# Patient Record
Sex: Female | Born: 1990 | Race: Black or African American | Hispanic: No | Marital: Single | Smoking: Never smoker
Health system: Southern US, Community
[De-identification: ages and names within clinical notes are randomized; demographics above are authoritative.]

## PROBLEM LIST (undated history)

## (undated) DIAGNOSIS — Z789 Other specified health status: Secondary | ICD-10-CM

## (undated) HISTORY — PX: NO PAST SURGERIES: SHX2092

---
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
## 2011-11-12 ENCOUNTER — Emergency Department (HOSPITAL_COMMUNITY)
Admission: EM | Admit: 2011-11-12 | Discharge: 2011-11-12 | Disposition: A | Discharge status: Home / Self Care | Payer: Self-pay | Attending: Emergency Medicine | Admitting: Emergency Medicine

## 2011-11-12 ENCOUNTER — Encounter (HOSPITAL_COMMUNITY): Payer: Self-pay | Admitting: *Deleted

## 2011-11-12 ENCOUNTER — Emergency Department (HOSPITAL_COMMUNITY): Payer: Self-pay

## 2011-11-12 DIAGNOSIS — R079 Chest pain, unspecified: Secondary | ICD-10-CM | POA: Insufficient documentation

## 2011-11-12 DIAGNOSIS — R071 Chest pain on breathing: Secondary | ICD-10-CM | POA: Insufficient documentation

## 2011-11-12 DIAGNOSIS — R0789 Other chest pain: Secondary | ICD-10-CM

## 2011-11-12 DIAGNOSIS — R091 Pleurisy: Secondary | ICD-10-CM | POA: Insufficient documentation

## 2011-11-12 LAB — CBC WITH DIFFERENTIAL/PLATELET
Basophils Absolute: 0.1 10*3/uL (ref 0.0–0.1)
Eosinophils Absolute: 0.1 10*3/uL (ref 0.0–0.7)
Lymphs Abs: 2.6 10*3/uL (ref 0.7–4.0)
MCH: 23.3 pg — ABNORMAL LOW (ref 26.0–34.0)
MCHC: 31.8 g/dL (ref 30.0–36.0)
MCV: 73.3 fL — ABNORMAL LOW (ref 78.0–100.0)
Monocytes Absolute: 0.8 10*3/uL (ref 0.1–1.0)
Monocytes Relative: 8 % (ref 3–12)
Platelets: 393 10*3/uL (ref 150–400)
RDW: 14.7 % (ref 11.5–15.5)
WBC: 9.8 10*3/uL (ref 4.0–10.5)

## 2011-11-12 LAB — BASIC METABOLIC PANEL
BUN: 10 mg/dL (ref 6–23)
Calcium: 9.3 mg/dL (ref 8.4–10.5)
Creatinine, Ser: 0.68 mg/dL (ref 0.50–1.10)
GFR calc Af Amer: >90 mL/min (ref >90)
GFR calc non Af Amer: >90 mL/min (ref >90)
Glucose, Bld: 83 mg/dL (ref 70–99)

## 2011-11-12 MED ORDER — OXYCODONE-ACETAMINOPHEN 5-325 MG PO TABS: 1.0000 | ORAL_TABLET | Freq: Four times a day (QID) | ORAL | Status: AC | PRN

## 2011-11-12 MED ORDER — OXYCODONE-ACETAMINOPHEN 5-325 MG PO TABS
1.0000 | ORAL_TABLET | Freq: Once | ORAL | Status: AC
  Administered 2011-11-12: 1 via ORAL

## 2011-11-12 MED ORDER — OXYCODONE-ACETAMINOPHEN 5-325 MG PO TABS
1.0000 | ORAL_TABLET | Freq: Once | ORAL | Status: DC
  Filled 2011-11-12: qty 1

## 2011-11-12 MED ORDER — IOHEXOL 350 MG/ML SOLN
86.0000 mL | Freq: Once | INTRAVENOUS | Status: AC | PRN
  Administered 2011-11-12: 86 mL via INTRAVENOUS

## 2011-11-12 NOTE — ED Notes (Signed)
Pt states started having right rib pain three days ago that started with out injury.  Pt reports hurts with deep breath and to lay on that side

## 2011-11-12 NOTE — ED Notes (Signed)
Patient transported to X-ray 

## 2011-11-12 NOTE — ED Notes (Signed)
Pt reports (R) side rib pain x3 days, increase pain w/deep breathing or laying on her (R) side. Pt denies chest pain, N/V, or cough, pt also denies any injury to area.

## 2011-11-12 NOTE — ED Notes (Signed)
MD at bedside. Dr. Plunkett. 

## 2011-11-12 NOTE — ED Provider Notes (Addendum)
History     CSN: [id number]  Arrival date & time 11/12/11  0905   First MD Initiated Contact with Patient 11/12/11 [phone]      Chief Complaint  Patient presents with  . right rib pain     (Consider location/radiation/quality/duration/timing/severity/associated sxs/prior treatment) Patient is a 21 y.o. female presenting with chest pain. The history is provided by the patient.  Chest Pain Episode onset: 3 days ago. Duration of episode(s) is 3 days. Chest pain occurs constantly. The chest pain is worsening. The pain is associated with breathing and coughing. At its most intense, the pain is at 9/10. The pain is currently at 9/10. The severity of the pain is severe. The quality of the pain is described as pleuritic, sharp and stabbing. The pain does not radiate. Chest pain is worsened by certain positions and deep breathing (Worse with lying on the right side and when taking deep breath). Pertinent negatives for primary symptoms include no fever, no syncope, no shortness of breath, no cough, no abdominal pain, no nausea and no vomiting.  Pertinent negatives for associated symptoms include no lower extremity edema. She tried nothing for the symptoms. Risk factors include no known risk factors (Former smoker but not currently smoking).  Pertinent negatives for past medical history include no diabetes and no hypertension.     History reviewed. No pertinent past medical history.  History reviewed. No pertinent past surgical history.  No family history on file.  History  Substance Use Topics  . Smoking status: Former Games developer  . Smokeless tobacco: Not on file  . Alcohol Use: No    OB History    Grav Para Term Preterm Abortions TAB SAB Ect Mult Living                  Review of Systems  Constitutional: Negative for fever.  Respiratory: Negative for cough and shortness of breath.   Cardiovascular: Positive for chest pain. Negative for syncope.  Gastrointestinal: Negative for nausea,  vomiting and abdominal pain.  All other systems reviewed and are negative.    Allergies  Review of patient's allergies indicates no known allergies.  Home Medications  No current outpatient prescriptions on file.  BP 118/70  Pulse 98  Temp 98 F (36.7 C) (Oral)  Resp 20  SpO2 100%  LMP 11/04/2011  Physical Exam  Nursing note and vitals reviewed. Constitutional: She is oriented to person, place, and time. She appears well-developed and well-nourished. No distress.  HENT:  Head: Normocephalic and atraumatic.  Mouth/Throat: Oropharynx is clear and moist.  Eyes: Conjunctivae and EOM are normal. Pupils are equal, round, and reactive to light.  Neck: Normal range of motion. Neck supple.  Cardiovascular: Normal rate, regular rhythm and intact distal pulses.   No murmur heard. Pulmonary/Chest: Effort normal. No respiratory distress. She has decreased breath sounds. She has no wheezes. She has no rales. She exhibits tenderness and bony tenderness. She exhibits no crepitus, no deformity and no retraction.         Patient is not able to take deep breaths due to pain with inspiration. Breath sounds on the right are decreased but unclear if that is due to her  Abdominal: Soft. She exhibits no distension. There is no tenderness. There is no rebound and no guarding.  Musculoskeletal: Normal range of motion. She exhibits no edema and no tenderness.  Neurological: She is alert and oriented to person, place, and time.  Skin: Skin is warm and dry. No rash noted. No  erythema.  Psychiatric: She has a normal mood and affect. Her behavior is normal.    ED Course  Procedures (including critical care time)  Labs Reviewed  CBC WITH DIFFERENTIAL - Abnormal; Notable for the following:    Hemoglobin 10.1 (*)     HCT 31.8 (*)     MCV 73.3 (*)     MCH 23.3 (*)     All other components within normal limits  D-DIMER, QUANTITATIVE - Abnormal; Notable for the following:    D-Dimer, Quant 1.71 (*)       All other components within normal limits  BASIC METABOLIC PANEL   Dg Chest 2 View  11/12/2011  *RADIOLOGY REPORT*  Clinical Data: Right rib pain  CHEST - 2 VIEW  Comparison: None.  Findings: Lungs are clear. No pleural effusion or pneumothorax.  Cardiomediastinal silhouette is within normal limits.  Visualized osseous structures are within normal limits.  IMPRESSION: Normal chest radiographs.  Original Report Authenticated By: Charline Bills, M.D.   Ct Angio Chest Pe W/cm &/or Wo Cm  11/12/2011  *RADIOLOGY REPORT*  Clinical Data: Right rib pain  CT ANGIOGRAPHY CHEST  Technique:  Multidetector CT imaging of the chest using the standard protocol during bolus administration of intravenous contrast. Multiplanar reconstructed images including MIPs were obtained and reviewed to evaluate the vascular anatomy.  Contrast: 86mL OMNIPAQUE IOHEXOL 350 MG/ML SOLN  Comparison: Chest radiographs dated 11/12/2011  Findings: No evidence of pulmonary embolism.  The lungs are clear. No pleural effusion or pneumothorax.  Visualized thyroid is unremarkable.  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  The heart is normal in size.  No pericardial effusion.  No suspicious mediastinal, hilar, or axillary lymphadenopathy.  Visualized upper abdomen is unremarkable.  Visualized osseous structures are within normal limits.  IMPRESSION: No evidence of pulmonary embolism.  Normal CT chest.  Original Report Authenticated By: Charline Bills, M.D.     Date: 11/12/2011  Rate: 82  Rhythm: normal sinus rhythm  QRS Axis: normal  Intervals: normal  ST/T Wave abnormalities: normal  Conduction Disutrbances: none  Narrative Interpretation: unremarkable     1. Pleurisy   2. Right-sided chest wall pain       MDM   Patient with new onset of chest pain 3 days ago that has a pleuritic component. Denies any infectious symptoms such as fever cough has no PE risk factors including recent travel, surgeries, birth  control use or cigarette smoker. Patient has decreased breath sounds on the right however poor effort due to pain with inspiration. She has no abdominal findings concerning for gallbladder pathology and she is denying any nausea, vomiting, abdominal pain or diarrhea. Concern for pneumothorax versus PE versus pleurisy.  CBC, BMP, d-dimer, chest x-ray, EKG pending.  10:45 AM D-dimer is elevated. Chest x-ray within normal limits an EKG normal. Patient sent for CTA  12:34 PM   CT of the chest is negative. Patient's pain is much improved after Percocet. Most likely pleurisy and will discharge home.  Gwyneth Sprout, MD 11/12/11 1234  Gwyneth Sprout, MD 11/12/11 1236

## 2011-11-12 NOTE — ED Notes (Signed)
Patient transported to CT 

## 2011-11-27 ENCOUNTER — Emergency Department (INDEPENDENT_AMBULATORY_CARE_PROVIDER_SITE_OTHER)
Admission: EM | Admit: 2011-11-27 | Discharge: 2011-11-27 | Disposition: A | Discharge status: Home / Self Care | Payer: Self-pay | Source: Home / Self Care | Attending: Family Medicine | Admitting: Family Medicine

## 2011-11-27 ENCOUNTER — Encounter (HOSPITAL_COMMUNITY): Payer: Self-pay | Admitting: *Deleted

## 2011-11-27 DIAGNOSIS — N939 Abnormal uterine and vaginal bleeding, unspecified: Secondary | ICD-10-CM

## 2011-11-27 DIAGNOSIS — N898 Other specified noninflammatory disorders of vagina: Secondary | ICD-10-CM

## 2011-11-27 LAB — POCT URINALYSIS DIP (DEVICE)
Leukocytes, UA: NEGATIVE
Nitrite: NEGATIVE
Protein, ur: 30 mg/dL — AB
pH: 6 (ref 5.0–8.0)

## 2011-11-27 LAB — WET PREP, GENITAL
Clue Cells Wet Prep HPF POC: NONE SEEN
WBC, Wet Prep HPF POC: NONE SEEN
Yeast Wet Prep HPF POC: NONE SEEN

## 2011-11-27 MED ORDER — NORGESTIM-ETH ESTRAD TRIPHASIC 0.18/0.215/0.25 MG-25 MCG PO TABS: 1.0000 | ORAL_TABLET | Freq: Every day | ORAL | Status: DC

## 2011-11-27 NOTE — ED Notes (Signed)
Pt  Reports  Symptoms  Of  Vaginal  Bleeding  With  Extended  Menses       -  Pt  Reports  debveloped  Low  abd  Pain      Yesterday  Pt   Reports  Has  Not  Had  A  Normal period  In  Several  Months        Pt  Appears  In no  Distress  She  Ambulates  Upright with a  Steady  gait

## 2011-11-27 NOTE — ED Provider Notes (Signed)
History     CSN: [id number]  Arrival date & time 11/27/11  1637   None     Chief Complaint  Patient presents with  . Vaginal Bleeding    (Consider location/radiation/quality/duration/timing/severity/associated sxs/prior treatment) Patient is a 21 y.o. female presenting with vaginal bleeding. The history is provided by the patient. No language interpreter was used.  Vaginal Bleeding This is a new problem. The problem occurs constantly. The problem has been gradually worsening. Nothing aggravates the symptoms. Nothing relieves the symptoms. She has tried nothing for the symptoms.   Pt complains of vaginal bleeding for 3 out of 4 weeks this month.   History reviewed. No pertinent past medical history.  History reviewed. No pertinent past surgical history.  No family history on file.  History  Substance Use Topics  . Smoking status: Former Games developer  . Smokeless tobacco: Not on file  . Alcohol Use: No    OB History    Grav Para Term Preterm Abortions TAB SAB Ect Mult Living                  Review of Systems  Genitourinary: Positive for vaginal bleeding.  All other systems reviewed and are negative.    Allergies  Review of patient's allergies indicates no known allergies.  Home Medications  No current outpatient prescriptions on file.  BP 114/72  Pulse 91  Temp 98.3 F (36.8 C) (Oral)  Resp 18  SpO2 100%  LMP 11/04/2011  Physical Exam  Vitals reviewed. Constitutional: She is oriented to person, place, and time. She appears well-developed and well-nourished.  HENT:  Head: Normocephalic and atraumatic.  Neck: Normal range of motion.  Cardiovascular: Normal rate and regular rhythm.   Pulmonary/Chest: Effort normal and breath sounds normal.  Abdominal: Soft. Bowel sounds are normal.  Genitourinary: Vagina normal and uterus normal.       Moderate vaginal bleeding  Musculoskeletal: Normal range of motion.  Neurological: She is alert and oriented to person,  place, and time. She has normal reflexes.  Skin: Skin is warm.  Psychiatric: She has a normal mood and affect.    ED Course  Procedures (including critical care time)  Labs Reviewed  POCT URINALYSIS DIP (DEVICE) - Abnormal; Notable for the following:    Bilirubin Urine SMALL (*)     Hgb urine dipstick LARGE (*)     Protein, ur 30 (*)     All other components within normal limits  POCT PREGNANCY, URINE   No results found.   1. Abnormal vaginal bleeding       MDM  trisprintec Pt referred to Dr. Ellyn Hack for followup        Elson Areas, PA 11/27/11 1908  Lonia Skinner [place], [place] 11/27/11 1910

## 2011-11-29 NOTE — ED Provider Notes (Signed)
Medical screening examination/treatment/procedure(s) were performed by resident physician or non-physician practitioner and as supervising physician I was immediately available for consultation/collaboration.   Lancelot Alyea DOUGLAS MD.    Inaara Tye D Chanse Kagel, MD 11/29/11 1843 

## 2012-04-29 ENCOUNTER — Emergency Department (INDEPENDENT_AMBULATORY_CARE_PROVIDER_SITE_OTHER)
Admission: EM | Admit: 2012-04-29 | Discharge: 2012-04-29 | Disposition: A | Discharge status: Home / Self Care | Payer: Self-pay | Source: Home / Self Care | Attending: Family Medicine | Admitting: Family Medicine

## 2012-04-29 ENCOUNTER — Other Ambulatory Visit (HOSPITAL_COMMUNITY)
Admission: RE | Admit: 2012-04-29 | Discharge: 2012-04-29 | Disposition: A | Discharge status: Home / Self Care | Payer: Self-pay | Source: Ambulatory Visit | Attending: Emergency Medicine | Admitting: Emergency Medicine

## 2012-04-29 ENCOUNTER — Encounter (HOSPITAL_COMMUNITY): Payer: Self-pay | Admitting: Emergency Medicine

## 2012-04-29 DIAGNOSIS — N760 Acute vaginitis: Secondary | ICD-10-CM

## 2012-04-29 DIAGNOSIS — A499 Bacterial infection, unspecified: Secondary | ICD-10-CM

## 2012-04-29 DIAGNOSIS — T192XXA Foreign body in vulva and vagina, initial encounter: Secondary | ICD-10-CM

## 2012-04-29 DIAGNOSIS — N730 Acute parametritis and pelvic cellulitis: Secondary | ICD-10-CM

## 2012-04-29 DIAGNOSIS — Z113 Encounter for screening for infections with a predominantly sexual mode of transmission: Secondary | ICD-10-CM | POA: Insufficient documentation

## 2012-04-29 LAB — POCT URINALYSIS DIP (DEVICE)
Glucose, UA: NEGATIVE mg/dL
Leukocytes, UA: NEGATIVE
Protein, ur: 30 mg/dL — AB
Specific Gravity, Urine: 1.025 (ref 1.005–1.030)
Urobilinogen, UA: 1 mg/dL (ref 0.0–1.0)

## 2012-04-29 LAB — POCT PREGNANCY, URINE: Preg Test, Ur: NEGATIVE

## 2012-04-29 MED ORDER — CEFTRIAXONE SODIUM 1 G IJ SOLR
INTRAMUSCULAR | Status: AC
  Filled 2012-04-29: qty 10

## 2012-04-29 MED ORDER — METRONIDAZOLE 500 MG PO TABS: 500.0000 mg | ORAL_TABLET | Freq: Two times a day (BID) | ORAL | Status: DC

## 2012-04-29 MED ORDER — LIDOCAINE HCL (PF) 1 % IJ SOLN
INTRAMUSCULAR | Status: AC
  Filled 2012-04-29: qty 5

## 2012-04-29 MED ORDER — AZITHROMYCIN 250 MG PO TABS
1000.0000 mg | ORAL_TABLET | Freq: Every day | ORAL | Status: DC
  Administered 2012-04-29: 1000 mg via ORAL

## 2012-04-29 MED ORDER — AZITHROMYCIN 250 MG PO TABS
ORAL_TABLET | ORAL | Status: AC
  Filled 2012-04-29: qty 4

## 2012-04-29 MED ORDER — CEFTRIAXONE SODIUM 1 G IJ SOLR
1.0000 g | Freq: Once | INTRAMUSCULAR | Status: AC
  Administered 2012-04-29: 1 g via INTRAMUSCULAR

## 2012-04-29 NOTE — ED Notes (Signed)
Pt is c/o vag discharge x3 months Sx include: pelvic pain x6 months; was seen here at [facility] back in 08/13 for abn vag bleeding and pain Sx today include: white discharge, v/n Denies: dysuria, hematuria, fevers, diarrhea Pain is intermittent and has been getting worse this past week; increases w/activity Not taking any meds for the discomfort; no PCP or GYN LMP: 04/15/12  She is alert w/no sigs of acute distress.

## 2012-04-29 NOTE — ED Provider Notes (Signed)
History     CSN: [id number]  Arrival date & time 04/29/12  1216   First MD Initiated Contact with Patient 04/29/12 1227      Chief Complaint  Patient presents with  . Vaginal Discharge    (Consider location/radiation/quality/duration/timing/severity/associated sxs/prior treatment) HPI Comments: 22 year old female states she has had "cervix pain for 6 months. She was seen in our urgent care last July for pelvic symptoms. She states this pain is associated with a vaginal discharge. She denies fever, malaise or abdominal pain. She did have vomiting one time today.   History reviewed. No pertinent past medical history.  History reviewed. No pertinent past surgical history.  No family history on file.  History  Substance Use Topics  . Smoking status: Former Games developer  . Smokeless tobacco: Not on file  . Alcohol Use: No    OB History    Grav Para Term Preterm Abortions TAB SAB Ect Mult Living                  Review of Systems  Constitutional: Negative for fever, activity change and fatigue.  HENT: Negative.   Respiratory: Negative for cough, shortness of breath and wheezing.   Cardiovascular: Negative for chest pain and palpitations.  Gastrointestinal: Negative.   Genitourinary: Positive for vaginal discharge, vaginal pain and pelvic pain. Negative for dysuria, frequency, hematuria, flank pain and vaginal bleeding.  Musculoskeletal: Negative.   Skin: Negative for color change, pallor and rash.  Neurological: Negative.   Psychiatric/Behavioral: Negative.     Allergies  Review of patient's allergies indicates no known allergies.  Home Medications   Current Outpatient Rx  Name  Route  Sig  Dispense  Refill  . METRONIDAZOLE 500 MG PO TABS   Oral   Take 1 tablet (500 mg total) by mouth 2 (two) times daily. X 7 days   14 tablet   0   . NORGESTIM-ETH ESTRAD TRIPHASIC 0.18/0.215/0.25 MG-25 MCG PO TABS   Oral   Take 1 tablet by mouth daily.   1 Package   2     BP  124/77  Pulse 97  Temp 99.8 F (37.7 C) (Oral)  Resp 16  SpO2 100%  LMP 04/15/2012  Physical Exam  Nursing note and vitals reviewed. Constitutional: She is oriented to person, place, and time. She appears well-developed and well-nourished. No distress.  HENT:  Head: Normocephalic and atraumatic.  Mouth/Throat: No oropharyngeal exudate.  Eyes: EOM are normal.  Neck: Normal range of motion. Neck supple.  Cardiovascular: Normal rate and normal heart sounds.   Pulmonary/Chest: Effort normal and breath sounds normal. No respiratory distress.  Abdominal: Soft. She exhibits no distension. There is no tenderness. There is no rebound and no guarding.  Genitourinary:       Manual palpation reveals tenderness over the left and right pelvis. No abdominal tenderness. Normal external female genitalia Once the speculum was inserted a foreign body was seen adjacent to the cervix. This was removed with the speculum. It was flat in annular in shape, about 3 and half centimeters in diameter and appears to be a fragment of an old tampon. There is a pale green and discharge coating the cervix and vagina. Cervix is midline. The ectocervix is mildly erythematous but no lesions. Bimanual: Positive for sore auricle motion tenderness and adnexal tenderness. There was additional foreign body material palpated during the digital exam and then removed. This material similar to the initial material that was removed.  Musculoskeletal: Normal range of motion.  Neurological: She is alert and oriented to person, place, and time. No cranial nerve deficit.  Skin: Skin is warm and dry.  Psychiatric: She has a normal mood and affect.    ED Course  Procedures (including critical care time)  Labs Reviewed  POCT URINALYSIS DIP (DEVICE) - Abnormal; Notable for the following:    Bilirubin Urine SMALL (*)     Ketones, ur TRACE (*)     Protein, ur 30 (*)     All other components within normal limits  POCT PREGNANCY,  URINE  CERVICOVAGINAL ANCILLARY ONLY   No results found.   1. PID (acute pelvic inflammatory disease)   2. Foreign body in vagina   3. BV (bacterial vaginosis)       MDM  I suspect the patient has had a chronic pelvic infection for several months. This has been exacerbated by the presence of an old foreign body. She exhibits no systemic symptoms such as fever or weakness. Symptoms did not suggest toxic shock syndrome. Rocephin 1 g IM Azithromycin 1 g by mouth Flagyl 500 mg twice a day for 7 days If no improvement in 7-10 days recommend go to the [facility] for further examination which may include ultrasound. If worse 6 medical attention promptly. Especially if he has fever worsening abdominal pain or discharge.         Hayden Rasmussen, NP 04/29/12 [phone]

## 2012-05-01 NOTE — ED Provider Notes (Signed)
Medical screening examination/treatment/procedure(s) were performed by non-physician practitioner and as supervising physician I was immediately available for consultation/collaboration.   [facility]; MD   Sharin Grave, MD 05/01/12 1025

## 2012-05-17 ENCOUNTER — Telehealth (HOSPITAL_COMMUNITY): Payer: Self-pay | Admitting: *Deleted

## 2012-05-17 NOTE — ED Notes (Signed)
GC neg., Chlamydia pos., Affirm: Candida, Gardnerella and Trich all neg.  Pt. adequately treated with Zithromax and Rocephin.  I called pt. and she answered.  When I told her who I was she hung up. I called back and left a message to call.  DHHS form completed and faxed to the [facility]. Vassie Moselle 05/17/2012

## 2012-05-17 NOTE — ED Notes (Signed)
Pt. [place] back [place] and [place] dropped the call.  Pt. verified x 2 and given results.  Pt. told she was adequately treated.  Pt. instructed to notify her partner, no sex until partner is treated and to practice safe sex. Pt. told she can get HIV testing at the [facility]. STD clinic, by appointment.  Pt. voiced understanding. Vassie Moselle 05/17/2012

## 2012-06-19 ENCOUNTER — Encounter (HOSPITAL_COMMUNITY): Payer: Self-pay | Admitting: *Deleted

## 2012-06-19 ENCOUNTER — Inpatient Hospital Stay (HOSPITAL_COMMUNITY)
Admission: AD | Admit: 2012-06-19 | Discharge: 2012-06-19 | Disposition: A | Discharge status: Home / Self Care | Payer: Self-pay | Source: Ambulatory Visit | Attending: Family Medicine | Admitting: Family Medicine

## 2012-06-19 DIAGNOSIS — R109 Unspecified abdominal pain: Secondary | ICD-10-CM | POA: Insufficient documentation

## 2012-06-19 DIAGNOSIS — N390 Urinary tract infection, site not specified: Secondary | ICD-10-CM

## 2012-06-19 DIAGNOSIS — A499 Bacterial infection, unspecified: Secondary | ICD-10-CM

## 2012-06-19 DIAGNOSIS — B9689 Other specified bacterial agents as the cause of diseases classified elsewhere: Secondary | ICD-10-CM | POA: Insufficient documentation

## 2012-06-19 DIAGNOSIS — N760 Acute vaginitis: Secondary | ICD-10-CM | POA: Insufficient documentation

## 2012-06-19 DIAGNOSIS — B3749 Other urogenital candidiasis: Secondary | ICD-10-CM | POA: Insufficient documentation

## 2012-06-19 HISTORY — DX: Other specified health status: Z78.9

## 2012-06-19 LAB — URINALYSIS, ROUTINE W REFLEX MICROSCOPIC
Bilirubin Urine: NEGATIVE
Nitrite: NEGATIVE
Specific Gravity, Urine: <1.005 — ABNORMAL LOW (ref 1.005–1.030)
pH: 6.5 (ref 5.0–8.0)

## 2012-06-19 LAB — URINE MICROSCOPIC-ADD ON

## 2012-06-19 LAB — WET PREP, GENITAL

## 2012-06-19 LAB — CBC
MCHC: 29.2 g/dL — ABNORMAL LOW (ref 30.0–36.0)
MCV: 61.4 fL — ABNORMAL LOW (ref 78.0–100.0)
Platelets: 284 10*3/uL (ref 150–400)
RDW: 20.6 % — ABNORMAL HIGH (ref 11.5–15.5)
WBC: 7.8 10*3/uL (ref 4.0–10.5)

## 2012-06-19 LAB — POCT PREGNANCY, URINE: Preg Test, Ur: NEGATIVE

## 2012-06-19 MED ORDER — KETOROLAC TROMETHAMINE 60 MG/2ML IM SOLN
60.0000 mg | Freq: Once | INTRAMUSCULAR | Status: AC
  Administered 2012-06-19: 60 mg via INTRAMUSCULAR
  Filled 2012-06-19: qty 2

## 2012-06-19 MED ORDER — METRONIDAZOLE 500 MG PO TABS: 500.0000 mg | ORAL_TABLET | Freq: Two times a day (BID) | ORAL | Status: DC

## 2012-06-19 MED ORDER — FLUCONAZOLE 150 MG PO TABS: 150.0000 mg | ORAL_TABLET | Freq: Once | ORAL | Status: DC

## 2012-06-19 MED ORDER — SULFAMETHOXAZOLE-TMP DS 800-160 MG PO TABS: 1.0000 | ORAL_TABLET | Freq: Two times a day (BID) | ORAL | Status: DC

## 2012-06-19 NOTE — MAU Note (Signed)
Patient states she was diagnosed with chlamydia 1-2 months ago and states she got a shot and liquid medication which she vomited. Patient states she continues to have abdominal pain and a vagina discharge with an odor.

## 2012-06-19 NOTE — MAU Provider Note (Addendum)
History     CSN: [id number]  Arrival date and time: 06/19/12 1647   First Provider Initiated Contact with Patient 06/19/12 2032      Chief Complaint  Patient presents with  . Abdominal Pain  . Vaginal Discharge   HPI Teresa Cooper is 22 y.o. G0P0 presents with "ovary pain" X 3 days.  Was seen in 8/13 for dysfunctional bleeding treated with OCPs.  Was seen in 2/14 dx with chlamydia and treated.  She feels she had had chlamydia since 7/13 and wonders if this is cause of pain.  LMP 06/10/12--normal period for her.  Has not been sexually active since 7/13.  Has regular cycles monthly that last 3-4 days.   Has not taken anything for pain.  Past Medical History  Diagnosis Date  . Medical history non-contributory     Past Surgical History  Procedure Laterality Date  . No past surgeries      No family history on file.  History  Substance Use Topics  . Smoking status: Former Games developer  . Smokeless tobacco: Not on file  . Alcohol Use: No    Allergies: No Known Allergies  Prescriptions prior to admission  Medication Sig Dispense Refill  . metroNIDAZOLE (FLAGYL) 500 MG tablet Take 1 tablet (500 mg total) by mouth 2 (two) times daily. X 7 days  14 tablet  0  . [DISCONTINUED] Norgestimate-Ethinyl Estradiol Triphasic (ORTHO TRI-CYCLEN LO) 0.18/0.215/0.25 MG-25 MCG tab Take 1 tablet by mouth daily.  1 Package  2    Review of Systems  Constitutional: Negative for fever and chills.  Gastrointestinal: Positive for abdominal pain ("ovary pain").  Genitourinary: Negative for dysuria, urgency and frequency.       Negative for vaginal discharge or odor   Physical Exam   Blood pressure 120/69, pulse 83, temperature 98.5 F (36.9 C), temperature source Oral, resp. rate 16, height 5' (1.524 m), weight 136 lb 3.2 oz (61.78 kg), last menstrual period 06/10/2012, SpO2 100.00%.  Physical Exam  Constitutional: She is oriented to person, place, and time. She appears well-developed and  well-nourished. No distress.  HENT:  Head: Normocephalic.  Neck: Normal range of motion.  Cardiovascular: Normal rate.   Respiratory: Effort normal.  GI: Soft. She exhibits no distension and no mass. There is no tenderness. There is no rebound and no guarding.  Genitourinary: There is no rash, tenderness or lesion on the right labia. There is no rash, tenderness or lesion on the left labia. Uterus is not enlarged and not tender. Cervix exhibits no motion tenderness, no discharge and no friability. Right adnexum displays tenderness (mild ). Right adnexum displays no mass and no fullness. Left adnexum displays tenderness (mild). Left adnexum displays no mass and no fullness. No tenderness or bleeding around the vagina. Vaginal discharge (small amount of yellowish/white discharge without odor) found.  Neurological: She is alert and oriented to person, place, and time.  Skin: Skin is warm and dry.  Psychiatric: She has a normal mood and affect. Her behavior is normal.   Results for orders placed during the hospital encounter of 06/19/12 (from the past 24 hour(s))  URINALYSIS, ROUTINE W REFLEX MICROSCOPIC     Status: Abnormal   Collection Time    06/19/12  5:40 PM      Result Value Range   Color, Urine STRAW (*) YELLOW   APPearance CLEAR  CLEAR   Specific Gravity, Urine <1.005 (*) 1.005 - 1.030   pH 6.5  5.0 - 8.0   Glucose,  UA NEGATIVE  NEGATIVE mg/dL   Hgb urine dipstick TRACE (*) NEGATIVE   Bilirubin Urine NEGATIVE  NEGATIVE   Ketones, ur NEGATIVE  NEGATIVE mg/dL   Protein, ur NEGATIVE  NEGATIVE mg/dL   Urobilinogen, UA 0.2  0.0 - 1.0 mg/dL   Nitrite NEGATIVE  NEGATIVE   Leukocytes, UA MODERATE (*) NEGATIVE  URINE MICROSCOPIC-ADD ON     Status: Abnormal   Collection Time    06/19/12  5:40 PM      Result Value Range   Squamous Epithelial / LPF FEW (*) RARE   WBC, UA 0-2  <3 WBC/hpf   Bacteria, UA FEW (*) RARE   Urine-Other FEW YEAST    POCT PREGNANCY, URINE     Status: None    Collection Time    06/19/12  6:16 PM      Result Value Range   Preg Test, Ur NEGATIVE  NEGATIVE  CBC     Status: Abnormal   Collection Time    06/19/12  8:30 PM      Result Value Range   WBC 7.8  4.0 - 10.5 K/uL   RBC 4.30  3.87 - 5.11 MIL/uL   Hemoglobin 7.7 (*) 12.0 - 15.0 g/dL   HCT 11.9 (*) 14.7 - 82.9 %   MCV 61.4 (*) 78.0 - 100.0 fL   MCH 17.9 (*) 26.0 - 34.0 pg   MCHC 29.2 (*) 30.0 - 36.0 g/dL   RDW 56.2 (*) 13.0 - 86.5 %   Platelets 284  150 - 400 K/uL   MAU Course  Procedures  GC/CHL to lab  MDM  21:00 care turned over to N. Bascom Levels, CNM Assessment and Plan    KEY,EVE M 06/19/2012, 8:33 PM   Results for orders placed during the hospital encounter of 06/19/12 (from the past 24 hour(s))  URINALYSIS, ROUTINE W REFLEX MICROSCOPIC     Status: Abnormal   Collection Time    06/19/12  5:40 PM      Result Value Range   Color, Urine STRAW (*) YELLOW   APPearance CLEAR  CLEAR   Specific Gravity, Urine <1.005 (*) 1.005 - 1.030   pH 6.5  5.0 - 8.0   Glucose, UA NEGATIVE  NEGATIVE mg/dL   Hgb urine dipstick TRACE (*) NEGATIVE   Bilirubin Urine NEGATIVE  NEGATIVE   Ketones, ur NEGATIVE  NEGATIVE mg/dL   Protein, ur NEGATIVE  NEGATIVE mg/dL   Urobilinogen, UA 0.2  0.0 - 1.0 mg/dL   Nitrite NEGATIVE  NEGATIVE   Leukocytes, UA MODERATE (*) NEGATIVE  URINE MICROSCOPIC-ADD ON     Status: Abnormal   Collection Time    06/19/12  5:40 PM      Result Value Range   Squamous Epithelial / LPF FEW (*) RARE   WBC, UA 0-2  <3 WBC/hpf   Bacteria, UA FEW (*) RARE   Urine-Other FEW YEAST    POCT PREGNANCY, URINE     Status: None   Collection Time    06/19/12  6:16 PM      Result Value Range   Preg Test, Ur NEGATIVE  NEGATIVE  CBC     Status: Abnormal   Collection Time    06/19/12  8:30 PM      Result Value Range   WBC 7.8  4.0 - 10.5 K/uL   RBC 4.30  3.87 - 5.11 MIL/uL   Hemoglobin 7.7 (*) 12.0 - 15.0 g/dL   HCT 78.4 (*) 69.6 - 29.5 %  MCV 61.4 (*) 78.0 - 100.0 fL   MCH  17.9 (*) 26.0 - 34.0 pg   MCHC 29.2 (*) 30.0 - 36.0 g/dL   RDW 16.1 (*) 09.6 - 04.5 %   Platelets 284  150 - 400 K/uL  WET PREP, GENITAL     Status: Abnormal   Collection Time    06/19/12  8:50 PM      Result Value Range   Yeast Wet Prep HPF POC NONE SEEN  NONE SEEN   Trich, Wet Prep NONE SEEN  NONE SEEN   Clue Cells Wet Prep HPF POC FEW (*) NONE SEEN   WBC, Wet Prep HPF POC MODERATE (*) NONE SEEN   A/P:  1. BV (bacterial vaginosis)   2. UTI (urinary tract infection)   3. Candida UTI       Medication List    STOP taking these medications       Norgestimate-Ethinyl Estradiol Triphasic 0.18/0.215/0.25 MG-25 MCG tab  Commonly known as:  ORTHO TRI-CYCLEN LO      TAKE these medications       fluconazole 150 MG tablet  Commonly known as:  DIFLUCAN  Take 1 tablet (150 mg total) by mouth once. Repeat dose in 2 days.     metroNIDAZOLE 500 MG tablet  Commonly known as:  FLAGYL  Take 1 tablet (500 mg total) by mouth 2 (two) times daily.     sulfamethoxazole-trimethoprim 800-160 MG per tablet  Commonly known as:  BACTRIM DS  Take 1 tablet by mouth 2 (two) times daily.            Follow-up Information   Follow up with Default, Provider, MD. (As needed)    Contact information:   [address] [place] [address] [phone]

## 2012-06-19 NOTE — MAU Note (Signed)
Pt states she has been having pain in her lower abdomen.x x3 days. Pt states she was treated sometime last month for an STD.

## 2012-06-20 ENCOUNTER — Encounter (HOSPITAL_COMMUNITY): Payer: Self-pay | Admitting: Advanced Practice Midwife

## 2012-06-20 LAB — GC/CHLAMYDIA PROBE AMP: GC Probe RNA: NEGATIVE

## 2012-10-08 ENCOUNTER — Encounter (HOSPITAL_COMMUNITY): Payer: Self-pay | Admitting: Pharmacy Technician

## 2012-10-10 ENCOUNTER — Encounter (HOSPITAL_COMMUNITY): Payer: Self-pay

## 2012-10-10 ENCOUNTER — Encounter (HOSPITAL_COMMUNITY)
Admission: RE | Admit: 2012-10-10 | Discharge: 2012-10-10 | Disposition: A | Discharge status: Home / Self Care | Payer: BC Managed Care – PPO | Source: Ambulatory Visit | Attending: Obstetrics and Gynecology | Admitting: Obstetrics and Gynecology

## 2012-10-10 LAB — CBC
HCT: 36.4 % (ref 36.0–46.0)
Hemoglobin: 11.2 g/dL — ABNORMAL LOW (ref 12.0–15.0)
MCV: 73.5 fL — ABNORMAL LOW (ref 78.0–100.0)
RBC: 4.95 MIL/uL (ref 3.87–5.11)
RDW: 20.2 % — ABNORMAL HIGH (ref 11.5–15.5)
WBC: 6.6 10*3/uL (ref 4.0–10.5)

## 2012-10-10 NOTE — Patient Instructions (Addendum)
[name]  10/10/2012   Your procedure is scheduled on:  10/17/12  Enter through the Main Entrance of [facility] at 6 AM.  Pick up the phone at the desk and dial [phone].   Call this number if you have problems the morning of surgery: [phone]   Remember:   Do not eat food:After Midnight.  Do not drink clear liquids: After Midnight.  Take these medicines the morning of surgery with A SIP OF WATER: NA   Do not wear jewelry, make-up or nail polish.  Do not wear lotions, powders, or perfumes. You may wear deodorant.  Do not shave 48 hours prior to surgery.  Do not bring valuables to the hospital.  [facility] is not responsible                  for any belongings or valuables brought to the hospital.  Contacts, dentures or bridgework may not be worn into surgery.  Leave suitcase in the car. After surgery it may be brought to your room.  For patients admitted to the hospital, checkout time is 11:00 AM the day of                discharge.   Patients discharged the day of surgery will not be allowed to drive                   home.  Name and phone number of your driver: Mother  [name]  Special Instructions: Shower using CHG 2 nights before surgery and the night before surgery.  If you shower the day of surgery use CHG.  Use special wash - you have one bottle of CHG for all showers.  You should use approximately 1/3 of the bottle for each shower.   Please read over the following fact sheets that you were given: Surgical Site Infection Prevention

## 2012-10-11 NOTE — H&P (Signed)
  Patient name Teresa Cooper, Teresa Cooper DICTATION#  [id number] CSN# [id number]  Juluis Mire, MD 10/11/2012 12:13 PM

## 2012-10-11 NOTE — H&P (Signed)
Teresa Cooper, Teresa Cooper NO.:  [id number]  MEDICAL RECORD NO.:  [id number]  LOCATION:  [facility]                         FACILITY:  [facility]  PHYSICIAN:  Juluis Mire, M.D.   DATE OF BIRTH:  09-20-1990  DATE OF ADMISSION:  09/23/2012 DATE OF DISCHARGE:                             HISTORY & PHYSICAL   DATE OF SURGERY:  October 17, 2012, at [facility] here in [place].  HISTORY OF PRESENT ILLNESS:  The patient is a 22 year old nulligravida single female who came to the office on Aug 20, 2012.  She was having trouble with pelvic pain and discomfort.  She had developed a chronic lower abdominal pain.  She had had a past history of a positive Chlamydia that was treated.  Subsequent ultrasound evaluation here in the office did reveal a possible hydrosalpinx on the right side, was somewhat thick wall, suggesting inflammation.  She had a moderate amount of fluid in the cul- de-sac and left adnexa.  We went ahead and gave her a course of antibiotics as well as IM Rocephin.  We had her come back on September 19, 2012.  Ultrasound at that time, it felt like a hydrosalpinx was a bit smaller than previously had been seen, but she had multi-septated free fluid in the cul-de-sac, could be consistent with pelvic adhesions.  In view of the continued pain, discomfort, and these findings, we decided to proceed with laparoscopic evaluation.  Other options including watch this conservatively, we were declined by the patient.  ALLERGIES:  No known drug allergies.  MEDICATIONS:  Iron sulfate supplementation.  PAST MEDICAL HISTORY:  She has had usual childhood diseases.  No significant sequelae.  SOCIAL HISTORY:  No tobacco, alcohol use.  FAMILY HISTORY:  Noncontributory.  REVIEW OF SYSTEMS:  Noncontributory.  PHYSICAL EXAMINATION:  VITAL SIGNS:  The patient is afebrile with stable vital signs. HEENT:  The patient is normocephalic.  Pupils equal, round, and reactive to light and  accommodation.  Extraocular movements were intact.  Sclerae and conjunctivae clear.  Oropharynx clear. NECK:  Without thyromegaly. LUNGS:  Clear. CARDIOVASCULAR:  Regular rhythm and rate.  No murmurs or gallops. ABDOMEN:  Does have some left and right lower quadrant tenderness.  No peritoneal signs.  Bowel sounds were active.  No masses appreciated. PELVIC:  Normal external genitalia.  Vaginal mucosa is clear.  Cervix unremarkable.  Uterus normal size and shape, somewhat fixed.  Adnexa tender on both sides.  No masses appreciated. EXTREMITIES:  Trace edema.  NEUROLOGIC:  Grossly within normal limits.  IMPRESSION: 1. Possible past history of pelvic inflammatory disease with     subsequent pelvic adhesions, leading to increasing pain and     discomfort. 2. 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  PLAN:  The patient will undergo diagnostic laparoscopy with laser standby for evaluation and treatment.  The risks have been discussed including the risk of infection.  The risk of vascular injury could lead to hemorrhage, requiring transfusion with the risk of AIDS or hepatitis. Risk of injury to adjacent organs, this could include bladder, bowel, ureters that could require further exploratory surgery.  Risk of deep venous thrombosis and pulmonary embolus.  The patient expressed understanding of indications,  risks, and other options indications.     Juluis Mire, M.D.     JSM/MEDQ  D:  10/11/2012  T:  10/11/2012  Job:  [id number]

## 2012-10-17 ENCOUNTER — Ambulatory Visit (HOSPITAL_COMMUNITY)
Admission: RE | Admit: 2012-10-17 | Discharge: 2012-10-17 | Disposition: A | Discharge status: Home / Self Care | Payer: BC Managed Care – PPO | Source: Ambulatory Visit | Attending: Obstetrics and Gynecology | Admitting: Obstetrics and Gynecology

## 2012-10-17 ENCOUNTER — Encounter (HOSPITAL_COMMUNITY): Payer: Self-pay | Admitting: Anesthesiology

## 2012-10-17 ENCOUNTER — Encounter (HOSPITAL_COMMUNITY)
Admission: RE | Disposition: A | Discharge status: Home / Self Care | Payer: Self-pay | Source: Ambulatory Visit | Attending: Obstetrics and Gynecology

## 2012-10-17 ENCOUNTER — Ambulatory Visit (HOSPITAL_COMMUNITY): Payer: BC Managed Care – PPO | Admitting: Anesthesiology

## 2012-10-17 DIAGNOSIS — N949 Unspecified condition associated with female genital organs and menstrual cycle: Secondary | ICD-10-CM | POA: Insufficient documentation

## 2012-10-17 DIAGNOSIS — R109 Unspecified abdominal pain: Secondary | ICD-10-CM | POA: Insufficient documentation

## 2012-10-17 DIAGNOSIS — N803 Endometriosis of pelvic peritoneum, unspecified: Secondary | ICD-10-CM | POA: Insufficient documentation

## 2012-10-17 DIAGNOSIS — N736 Female pelvic peritoneal adhesions (postinfective): Secondary | ICD-10-CM | POA: Diagnosis present

## 2012-10-17 DIAGNOSIS — N809 Endometriosis, unspecified: Secondary | ICD-10-CM | POA: Diagnosis present

## 2012-10-17 HISTORY — PX: LAPAROSCOPY: SHX197

## 2012-10-17 HISTORY — PX: CHROMOPERTUBATION: SHX6288

## 2012-10-17 HISTORY — PX: LAPAROSCOPIC LYSIS OF ADHESIONS: SHX5905

## 2012-10-17 LAB — HCG, SERUM, QUALITATIVE: Preg, Serum: NEGATIVE

## 2012-10-17 SURGERY — LYSIS, ADHESIONS, LAPAROSCOPIC
Anesthesia: General | Site: Vagina | Wound class: Clean Contaminated

## 2012-10-17 MED ORDER — BUPIVACAINE HCL (PF) 0.25 % IJ SOLN
INTRAMUSCULAR | Status: DC | PRN
  Administered 2012-10-17: 5 mL

## 2012-10-17 MED ORDER — NEOSTIGMINE METHYLSULFATE 1 MG/ML IJ SOLN
INTRAMUSCULAR | Status: DC | PRN
Start: 2012-10-17 — End: 2012-10-17
  Administered 2012-10-17: 2 mg via INTRAVENOUS
  Administered 2012-10-17: 4 mg via INTRAVENOUS

## 2012-10-17 MED ORDER — LACTATED RINGERS IV SOLN
INTRAVENOUS | Status: DC
  Administered 2012-10-17 (×2): via INTRAVENOUS

## 2012-10-17 MED ORDER — GLYCOPYRROLATE 0.2 MG/ML IJ SOLN
INTRAMUSCULAR | Status: DC | PRN
  Administered 2012-10-17: 0.4 mg via INTRAVENOUS
  Administered 2012-10-17: 0.6 mg via INTRAVENOUS

## 2012-10-17 MED ORDER — FENTANYL CITRATE 0.05 MG/ML IJ SOLN: 25.0000 ug | INTRAMUSCULAR | Status: DC | PRN

## 2012-10-17 MED ORDER — ROCURONIUM BROMIDE 50 MG/5ML IV SOLN
INTRAVENOUS | Status: AC
  Filled 2012-10-17: qty 1

## 2012-10-17 MED ORDER — LACTATED RINGERS IV SOLN
INTRAVENOUS | Status: DC
  Administered 2012-10-17: 125 mL/h via INTRAVENOUS

## 2012-10-17 MED ORDER — INDIGOTINDISULFONATE SODIUM 8 MG/ML IJ SOLN
INTRAMUSCULAR | Status: AC
  Filled 2012-10-17: qty 5

## 2012-10-17 MED ORDER — MIDAZOLAM HCL 5 MG/5ML IJ SOLN: INTRAMUSCULAR | Status: DC | PRN

## 2012-10-17 MED ORDER — FENTANYL CITRATE 0.05 MG/ML IJ SOLN
INTRAMUSCULAR | Status: DC | PRN
  Administered 2012-10-17 (×7): 50 ug via INTRAVENOUS

## 2012-10-17 MED ORDER — FLUMAZENIL 1 MG/10ML IV SOLN
INTRAVENOUS | Status: AC
  Filled 2012-10-17: qty 10

## 2012-10-17 MED ORDER — NEOSTIGMINE METHYLSULFATE 1 MG/ML IJ SOLN
INTRAMUSCULAR | Status: AC
  Filled 2012-10-17: qty 1

## 2012-10-17 MED ORDER — MEPERIDINE HCL 25 MG/ML IJ SOLN: 6.2500 mg | INTRAMUSCULAR | Status: DC | PRN

## 2012-10-17 MED ORDER — ONDANSETRON HCL 4 MG/2ML IJ SOLN
INTRAMUSCULAR | Status: DC | PRN
  Administered 2012-10-17: 4 mg via INTRAVENOUS

## 2012-10-17 MED ORDER — CEFAZOLIN SODIUM-DEXTROSE 2-3 GM-% IV SOLR
2.0000 g | INTRAVENOUS | Status: AC
  Administered 2012-10-17: 2 g via INTRAVENOUS

## 2012-10-17 MED ORDER — FLUMAZENIL 0.5 MG/5ML IV SOLN
INTRAVENOUS | Status: DC | PRN
  Administered 2012-10-17: 0.2 mg via INTRAVENOUS

## 2012-10-17 MED ORDER — DEXAMETHASONE SODIUM PHOSPHATE 10 MG/ML IJ SOLN
INTRAMUSCULAR | Status: AC
  Filled 2012-10-17: qty 1

## 2012-10-17 MED ORDER — PROMETHAZINE HCL 25 MG/ML IJ SOLN: 6.2500 mg | INTRAMUSCULAR | Status: DC | PRN

## 2012-10-17 MED ORDER — PROMETHAZINE HCL 25 MG/ML IJ SOLN
INTRAMUSCULAR | Status: AC
  Administered 2012-10-17: 6.25 mg via INTRAVENOUS
  Filled 2012-10-17: qty 1

## 2012-10-17 MED ORDER — DEXAMETHASONE SODIUM PHOSPHATE 10 MG/ML IJ SOLN
INTRAMUSCULAR | Status: DC | PRN
  Administered 2012-10-17: 10 mg via INTRAVENOUS

## 2012-10-17 MED ORDER — MIDAZOLAM HCL 2 MG/2ML IJ SOLN
INTRAMUSCULAR | Status: AC
  Filled 2012-10-17: qty 2

## 2012-10-17 MED ORDER — ROCURONIUM BROMIDE 100 MG/10ML IV SOLN
INTRAVENOUS | Status: DC | PRN
  Administered 2012-10-17: 35 mg via INTRAVENOUS
  Administered 2012-10-17: 15 mg via INTRAVENOUS

## 2012-10-17 MED ORDER — KETOROLAC TROMETHAMINE 30 MG/ML IJ SOLN
INTRAMUSCULAR | Status: AC
  Filled 2012-10-17: qty 2

## 2012-10-17 MED ORDER — MIDAZOLAM HCL 5 MG/5ML IJ SOLN
INTRAMUSCULAR | Status: DC | PRN
  Administered 2012-10-17: 2 mg via INTRAVENOUS

## 2012-10-17 MED ORDER — BUPIVACAINE HCL (PF) 0.25 % IJ SOLN
INTRAMUSCULAR | Status: AC
  Filled 2012-10-17: qty 30

## 2012-10-17 MED ORDER — LIDOCAINE HCL (CARDIAC) 20 MG/ML IV SOLN
INTRAVENOUS | Status: DC | PRN
  Administered 2012-10-17: 30 mg via INTRAVENOUS
  Administered 2012-10-17: 20 mg via INTRAVENOUS

## 2012-10-17 MED ORDER — INDIGOTINDISULFONATE SODIUM 8 MG/ML IJ SOLN
INTRAMUSCULAR | Status: DC | PRN
  Administered 2012-10-17: 40 mg via INTRAVENOUS

## 2012-10-17 MED ORDER — NEOSTIGMINE METHYLSULFATE 1 MG/ML IJ SOLN: INTRAMUSCULAR | Status: DC | PRN

## 2012-10-17 MED ORDER — LIDOCAINE HCL (CARDIAC) 20 MG/ML IV SOLN
INTRAVENOUS | Status: AC
  Filled 2012-10-17: qty 5

## 2012-10-17 MED ORDER — FENTANYL CITRATE 0.05 MG/ML IJ SOLN
INTRAMUSCULAR | Status: AC
  Filled 2012-10-17: qty 2

## 2012-10-17 MED ORDER — GLYCOPYRROLATE 0.2 MG/ML IJ SOLN: INTRAMUSCULAR | Status: DC | PRN

## 2012-10-17 MED ORDER — LACTATED RINGERS IR SOLN
Status: DC | PRN
  Administered 2012-10-17: 3000 mL

## 2012-10-17 MED ORDER — PROPOFOL 10 MG/ML IV BOLUS
INTRAVENOUS | Status: DC | PRN
  Administered 2012-10-17: 200 mg via INTRAVENOUS

## 2012-10-17 MED ORDER — GLYCOPYRROLATE 0.2 MG/ML IJ SOLN
INTRAMUSCULAR | Status: AC
  Filled 2012-10-17: qty 3

## 2012-10-17 MED ORDER — KETOROLAC TROMETHAMINE 30 MG/ML IJ SOLN
INTRAMUSCULAR | Status: AC
  Filled 2012-10-17: qty 1

## 2012-10-17 MED ORDER — FENTANYL CITRATE 0.05 MG/ML IJ SOLN
INTRAMUSCULAR | Status: AC
  Filled 2012-10-17: qty 5

## 2012-10-17 MED ORDER — PROPOFOL 10 MG/ML IV EMUL
INTRAVENOUS | Status: AC
  Filled 2012-10-17: qty 20

## 2012-10-17 MED ORDER — KETOROLAC TROMETHAMINE 30 MG/ML IJ SOLN
INTRAMUSCULAR | Status: DC | PRN
  Administered 2012-10-17 (×2): 30 mg via INTRAVENOUS

## 2012-10-17 MED ORDER — ONDANSETRON HCL 4 MG/2ML IJ SOLN
INTRAMUSCULAR | Status: AC
  Filled 2012-10-17: qty 2

## 2012-10-17 MED ORDER — CEFAZOLIN SODIUM-DEXTROSE 2-3 GM-% IV SOLR
INTRAVENOUS | Status: AC
  Filled 2012-10-17: qty 50

## 2012-10-17 MED ORDER — GLYCOPYRROLATE 0.2 MG/ML IJ SOLN
INTRAMUSCULAR | Status: AC
  Filled 2012-10-17: qty 2

## 2012-10-17 SURGICAL SUPPLY — 31 items
CABLE HIGH FREQUENCY MONO STRZ (ELECTRODE) ×3 IMPLANT
CATH ROBINSON RED A/P 16FR (CATHETERS) IMPLANT
CLOTH BEACON ORANGE TIMEOUT ST (SAFETY) ×3 IMPLANT
DERMABOND ADVANCED (GAUZE/BANDAGES/DRESSINGS) ×1
DERMABOND ADVANCED .7 DNX12 (GAUZE/BANDAGES/DRESSINGS) ×2 IMPLANT
DRSG COVADERM PLUS 2X2 (GAUZE/BANDAGES/DRESSINGS) ×6 IMPLANT
ELECT REM PT RETURN 9FT ADLT (ELECTROSURGICAL) ×3
ELECTRODE REM PT RTRN 9FT ADLT (ELECTROSURGICAL) ×2 IMPLANT
GLOVE BIO SURGEON STRL SZ7 (GLOVE) ×3 IMPLANT
GOWN PREVENTION PLUS XLARGE (GOWN DISPOSABLE) ×3 IMPLANT
GOWN STRL REIN XL XLG (GOWN DISPOSABLE) ×6 IMPLANT
NEEDLE INSUFFLATION 120MM (ENDOMECHANICALS) ×3 IMPLANT
NS IRRIG 1000ML POUR BTL (IV SOLUTION) ×3 IMPLANT
PACK LAPAROSCOPY BASIN (CUSTOM PROCEDURE TRAY) ×3 IMPLANT
POUCH SPECIMEN RETRIEVAL 10MM (ENDOMECHANICALS) IMPLANT
PROTECTOR NERVE ULNAR (MISCELLANEOUS) ×3 IMPLANT
SCISSORS LAP 5X35 DISP (ENDOMECHANICALS) IMPLANT
SCISSORS LAP 5X45 EPIX DISP (ENDOMECHANICALS) ×3 IMPLANT
SEALER TISSUE G2 CVD JAW 45CM (ENDOMECHANICALS) ×3 IMPLANT
SET IRRIG TUBING LAPAROSCOPIC (IRRIGATION / IRRIGATOR) IMPLANT
SUT VIC AB 3-0 PS2 18 (SUTURE) ×1
SUT VIC AB 3-0 PS2 18XBRD (SUTURE) ×2 IMPLANT
SUT VICRYL 0 ENDOLOOP (SUTURE) IMPLANT
SUT VICRYL 0 UR6 27IN ABS (SUTURE) IMPLANT
TOWEL OR 17X24 6PK STRL BLUE (TOWEL DISPOSABLE) ×6 IMPLANT
TRAY FOLEY BAG SILVER LF 14FR (CATHETERS) ×3 IMPLANT
TROCAR BALLN 12MMX100 BLUNT (TROCAR) IMPLANT
TROCAR OPTI TIP 5M 100M (ENDOMECHANICALS) ×6 IMPLANT
TROCAR XCEL DIL TIP R 11M (ENDOMECHANICALS) IMPLANT
WARMER LAPAROSCOPE (MISCELLANEOUS) ×3 IMPLANT
WATER STERILE IRR 1000ML POUR (IV SOLUTION) IMPLANT

## 2012-10-17 NOTE — Brief Op Note (Signed)
10/17/2012  9:08 AM  PATIENT:  Teresa Cooper  22 y.o. 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, pelvic adhesions  PROCEDURE:  Procedure(s): LAPAROSCOPY OPERATIVE WITH YAG LASER APPLICATION (N/A) LAPAROSCOPIC LYSIS OF ADHESIONS (N/A)  SURGEON:  Surgeon(s) and Role:    * Juluis Mire, MD - Primary  PHYSICIAN ASSISTANT:   ASSISTANTS: none   ANESTHESIA:   local and general  EBL:  Total I/O In: 1700 [I.V.:1700] Out: 750 [Urine:700; Blood:50]  BLOOD ADMINISTERED:none  DRAINS: none   LOCAL MEDICATIONS USED:  MARCAINE     SPECIMEN:  No Specimen  DISPOSITION OF SPECIMEN:  N/A  COUNTS:  YES  TOURNIQUET:  * No tourniquets in log *  DICTATION: .Other Dictation: Dictation Number [id number]  PLAN OF CARE: Discharge to home after PACU  PATIENT DISPOSITION:  PACU - hemodynamically stable.   Delay start of Pharmacological VTE agent (>24hrs) due to surgical blood loss or risk of bleeding: not applicable

## 2012-10-17 NOTE — Op Note (Signed)
Teresa Cooper, ERBY NO.:  [id number]  MEDICAL RECORD NO.:  [id number]  LOCATION:  [facility]                          FACILITY:  [facility]  PHYSICIAN:  Juluis Mire, M.D.   DATE OF BIRTH:  1990/04/16  DATE OF PROCEDURE:  10/17/2012 DATE OF DISCHARGE:  10/17/2012                              OPERATIVE REPORT   PREOPERATIVE DIAGNOSIS:  Pelvic adhesions, possible hydrosalpinx.  POSTOPERATIVE DIAGNOSIS:  Pelvic adhesions and pelvic endometriosis.  PROCEDURE:  Laparoscopy with lysis of adhesions and cautery of endometriotic implants.  SURGEON:  Juluis Mire, MD  ANESTHESIA:  General endotracheal.  BLOOD LOSS:  Minimal.  PACKS AND DRAINS:  None.  INTRAOPERATIVE BLOOD PLACED:  None.  COMPLICATIONS:  None.  INDICATIONS:  Noted in history and physical.  DESCRIPTION OF PROCEDURE:  The patient was taken to the OR, placed in the supine position.  After satisfactory level of general endotracheal anesthesia was obtained, the patient was placed in dorsal lithotomy position using the Allen stirrups.  The abdomen, perineum, and vagina were prepped with Betadine.  Bladder was emptied by catheterization. The Kahn cannula was put in place and secured with a single-tooth tenaculum.  IV tubing was soaked to this for chromohydrotubation.  The patient was draped in sterile field.  Subumbilical incision made with a knife.  Veress needle was introduced into the abdominal cavity.  Abdomen was insufflated with approximately 3 liters of carbon dioxide.  A 10/11 trocar was inserted.  Laparoscope was introduced.  There was no evidence of injury to adjacent organs.  A 5-mm trocar was put in place in the suprapubic area under direct visualization.  She did have perihepatic adhesions.  Appendix was retrocecal, but unremarkable.  Both lateral gutters were clear.  There were numerous adhesions obliterating the cul- de-sac adherent to the right tube and ovary to the pelvic sidewall and the  left tube and ovary.  We brought in the monopolar disposable scissors and began freeing up first the cul-de-sac.  All these adhesions were taken down.  We then went to the right adnexa.  We were able to elevate the tube and began freeing it from its adhesions to the pelvic sidewall using the monopolar scissors and we were able to free up the right ovary.  The right tube was clubbed.  Then we went to the left side.  Due to visualization, we put in a 2nd 5 mm trocar in the left lower quadrant after visualization of the epigastric vessels.  Using this access port, we were able to elevate the left tube and ovary. Again using the monopolar scissors, the adhesions were taken down and the ovary and tube were eventually freed from adhesions to the pelvic sidewall.  Left tube also appeared clubbed.  At this point in time, we attempted chromohydrotubation.  I could not get dye to go up into the uterus or tubes.  I tried several different positions and maneuvers, but we were unable to perform chromohydrotubation.  At this point in time, we irrigated the pelvis.  All irrigation was removed.  We had completely freed up the cul-de-sac.  She had 3 implants of endometriosis in the cul- de-sac that we retrieved  using the monopolar scissors.  We did place a Foley to straight drain halfway through the case because of the bladder filling up.  At this point in time, we had no evidence of active bleeding.  All adhesions had been taken down.  Tubes and ovaries were completely free.  Cul-de-sac was completely free.  At this point in time, we deflated the abdomen.  All trocars removed.  Subumbilical incision closed with interrupted subcuticulars of 4-0 Vicryl. Suprapubic and left lower quadrant incision was closed with Dermabond. The Foley was removed along with the Kahn cannula and single-tooth tenaculum.  The patient was taken out of the dorsal lithotomy incision. Once alert and extubated, transferred to  recovery in good condition. Sponge, instrument, and needle count was reported correct by circulating nurse x2.     Juluis Mire, M.D.     JSM/MEDQ  D:  10/17/2012  T:  10/17/2012  Job:  [id number]

## 2012-10-17 NOTE — Anesthesia Preprocedure Evaluation (Signed)
Anesthesia Evaluation  Patient identified by MRN, date of birth, ID band Patient awake    Reviewed: Allergy & Precautions, H&P , NPO status , Patient's Chart, lab work & pertinent test results  Airway Mallampati: II TM Distance: >3 FB Neck ROM: full    Dental no notable dental hx.    Pulmonary neg pulmonary ROS,  breath sounds clear to auscultation  Pulmonary exam normal       Cardiovascular Exercise Tolerance: Good negative cardio ROS  Rhythm:regular Rate:Normal     Neuro/Psych negative neurological ROS  negative psych ROS   GI/Hepatic negative GI ROS, Neg liver ROS,   Endo/Other  negative endocrine ROS  Renal/GU negative Renal ROS  negative genitourinary   Musculoskeletal   Abdominal   Peds  Hematology negative hematology ROS (+)   Anesthesia Other Findings   Reproductive/Obstetrics negative OB ROS                           Anesthesia Physical Anesthesia Plan  ASA: I  Anesthesia Plan: General and General ETT   Post-op Pain Management:    Induction:   Airway Management Planned:   Additional Equipment:   Intra-op Plan:   Post-operative Plan:   Informed Consent: I have reviewed the patients History and Physical, chart, labs and discussed the procedure including the risks, benefits and alternatives for the proposed anesthesia with the patient or authorized representative who has indicated his/her understanding and acceptance.   Dental Advisory Given  Plan Discussed with: CRNA  Anesthesia Plan Comments:         Anesthesia Quick Evaluation  

## 2012-10-17 NOTE — Op Note (Signed)
Patient name  Teresa Cooper, Tesmer DICTATION#  [id number] CSN# [id number]  Juluis Mire, MD 10/17/2012 9:09 AM

## 2012-10-17 NOTE — Anesthesia Postprocedure Evaluation (Signed)
Anesthesia Post Note  Patient: Teresa Cooper  Procedure(s) Performed: Procedure(s) (LRB): LAPAROSCOPIC LYSIS OF ADHESIONS (N/A) LAPAROSCOPY OPERATIVE (N/A) CHROMOPERTUBATION (N/A)  Anesthesia type: GA  Patient location: PACU  Post pain: Pain level controlled  Post assessment: Post-op Vital signs reviewed  Last Vitals:  Filed Vitals:   10/17/12 1015  BP: 91/62  Pulse: 66  Temp:   Resp: 20    Post vital signs: Reviewed  Level of consciousness: sedated  Complications: No apparent anesthesia complications

## 2012-10-17 NOTE — H&P (Signed)
  History and physical exam unchanged 

## 2012-10-17 NOTE — Transfer of Care (Signed)
Immediate Anesthesia Transfer of Care Note  Patient: Teresa Cooper  Procedure(s) Performed: Procedure(s) with comments: LAPAROSCOPIC LYSIS OF ADHESIONS (N/A) LAPAROSCOPY OPERATIVE (N/A) CHROMOPERTUBATION (N/A) - attempted  Patient Location: PACU  Anesthesia Type:General  Level of Consciousness: awake, sedated and patient cooperative  Airway & Oxygen Therapy: Patient Spontanous Breathing and Patient connected to nasal cannula oxygen  Post-op Assessment: Report given to PACU RN and Post -op Vital signs reviewed and stable  Post vital signs: Reviewed and stable  Complications: No apparent anesthesia complications

## 2012-10-18 ENCOUNTER — Encounter (HOSPITAL_COMMUNITY): Payer: Self-pay | Admitting: Obstetrics and Gynecology

## 2012-10-19 MED FILL — Heparin Sodium (Porcine) Inj 5000 Unit/ML: INTRAMUSCULAR | Qty: 1 | Status: AC

## 2012-11-27 ENCOUNTER — Ambulatory Visit (HOSPITAL_COMMUNITY)
Admission: RE | Admit: 2012-11-27 | Discharge: 2012-11-27 | Disposition: A | Discharge status: Home / Self Care | Payer: BC Managed Care – PPO | Source: Ambulatory Visit | Attending: Obstetrics and Gynecology | Admitting: Obstetrics and Gynecology

## 2012-11-27 ENCOUNTER — Other Ambulatory Visit (HOSPITAL_COMMUNITY): Payer: Self-pay | Admitting: Obstetrics and Gynecology

## 2012-11-27 DIAGNOSIS — R1110 Vomiting, unspecified: Secondary | ICD-10-CM | POA: Insufficient documentation

## 2012-11-27 DIAGNOSIS — K56609 Unspecified intestinal obstruction, unspecified as to partial versus complete obstruction: Secondary | ICD-10-CM | POA: Insufficient documentation

## 2013-03-24 ENCOUNTER — Emergency Department: Payer: Self-pay | Admitting: Emergency Medicine

## 2013-09-21 IMAGING — CT CT ANGIO CHEST
2 of 6 series · 19 of 46 positions shown · IV contrast (APPLIED)
Comparison: Chest radiographs dated 11/12/2011

CLINICAL DATA: Right rib pain

CT ANGIOGRAPHY CHEST
TECHNIQUE: Multidetector CT imaging of the chest using the
standard protocol during bolus administration of intravenous
contrast. Multiplanar reconstructed images including MIPs were
obtained and reviewed to evaluate the vascular anatomy.
Contrast: 86mL OMNIPAQUE IOHEXOL 350 MG/ML SOLN

[Series 7: pulm embolism 1.0 b25f thin · axial · 0.54mm/px · z∈[-222,-34]mm · 16 of 206 slices shown]
[im 9/206  lung]
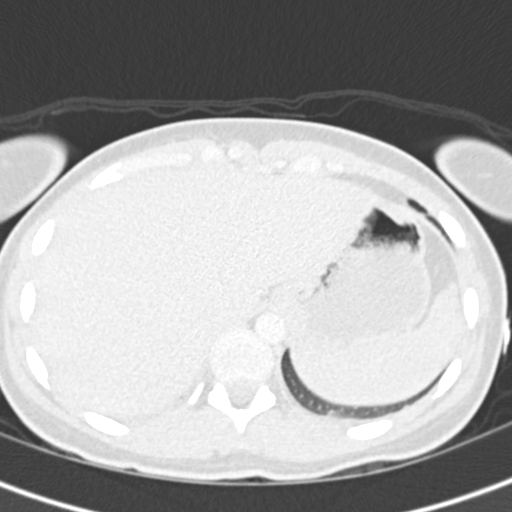
[im 27/206  soft-tissue]
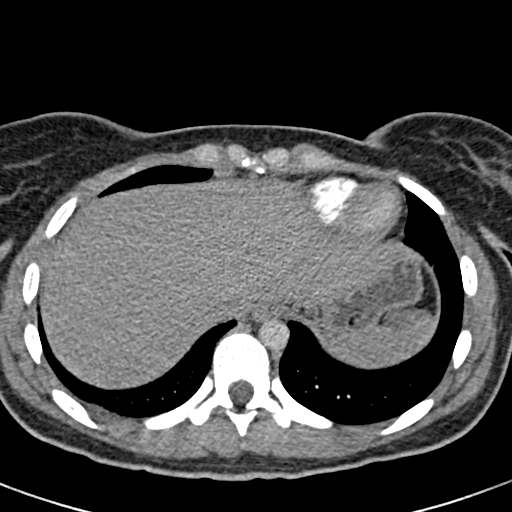
[im 36/206  lung]
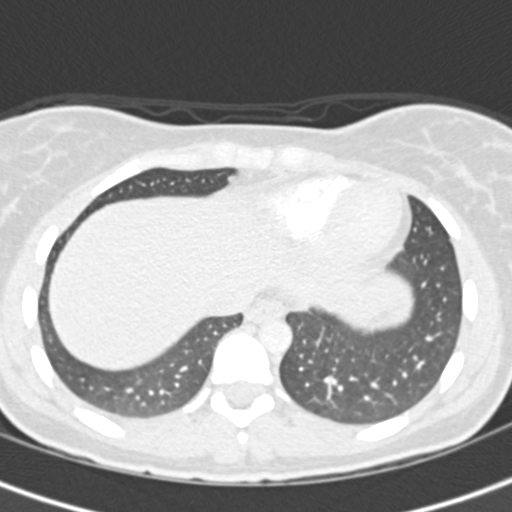
[im 45/206  soft-tissue]
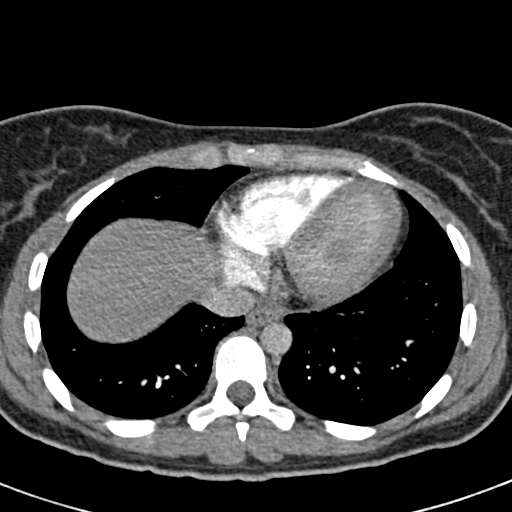
[im 63/206  lung]
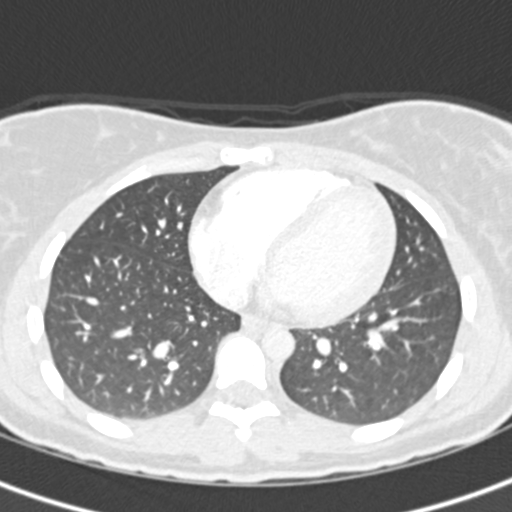
[im 72/206  soft-tissue]
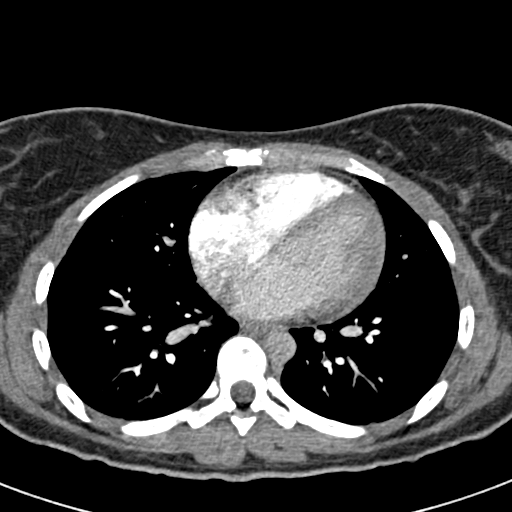
[im 81/206  lung]
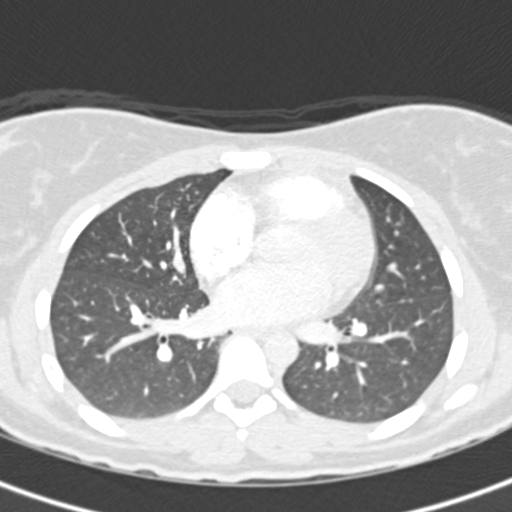
[im 99/206  soft-tissue]
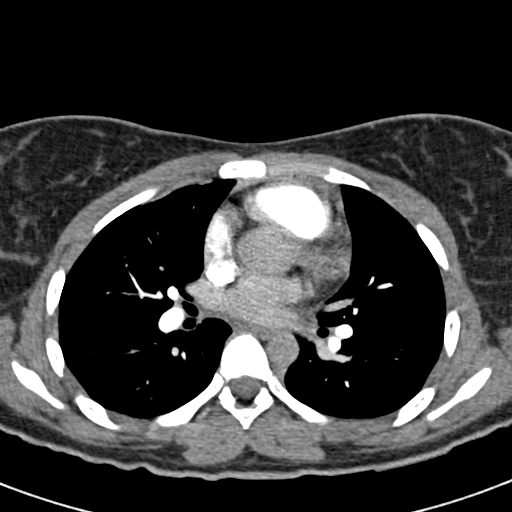
[im 107/206  lung]
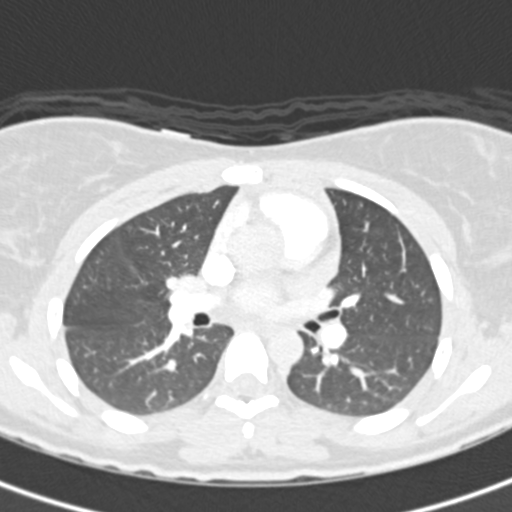
[im 125/206  soft-tissue]
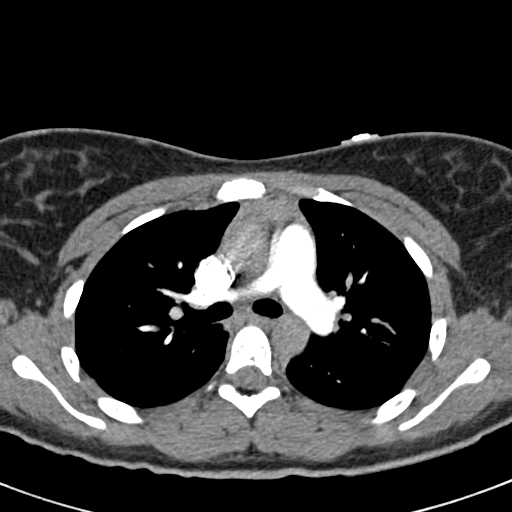
[im 134/206  lung]
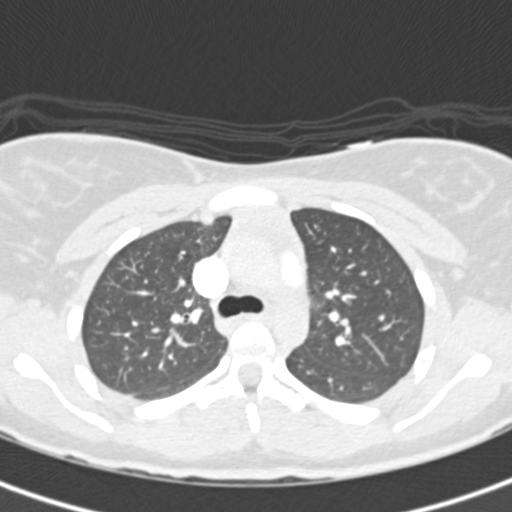
[im 143/206  soft-tissue]
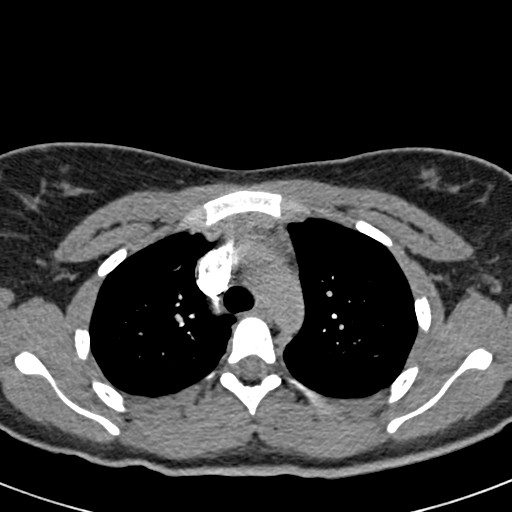
[im 161/206  lung]
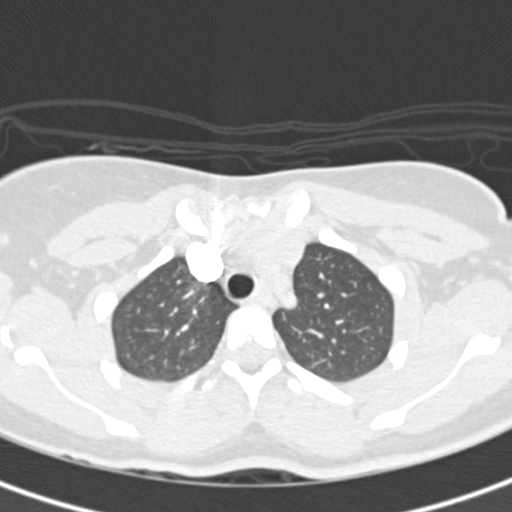
[im 170/206  soft-tissue]
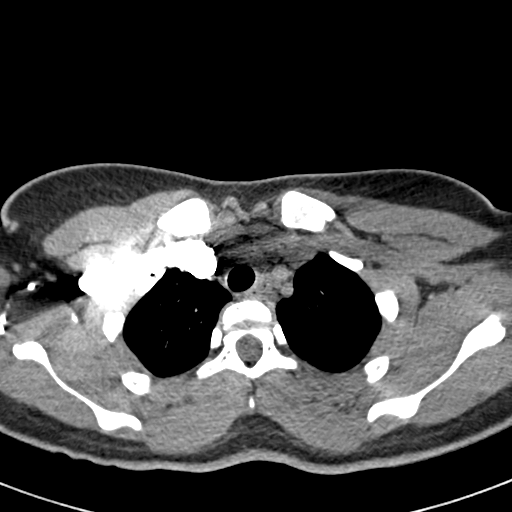
[im 179/206  lung]
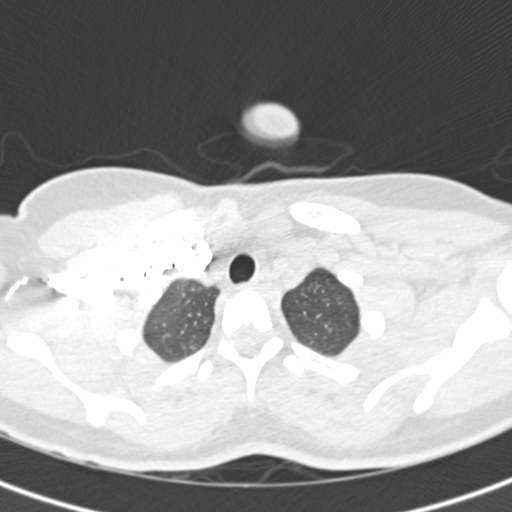
[im 197/206  soft-tissue]
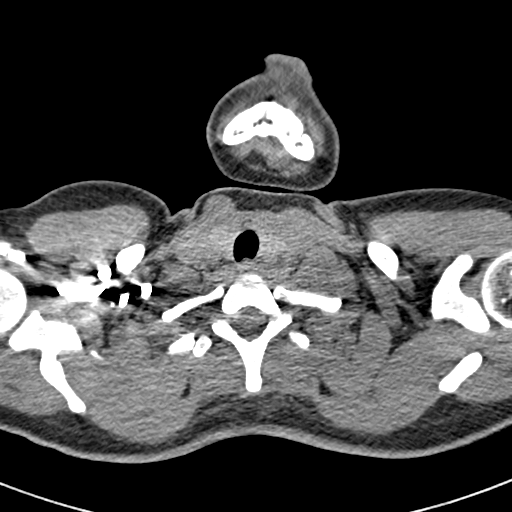

[Series 602: coronal mpr · coronal · 0.54mm/px · 3 of 83 slices shown]
[im 21/83  soft-tissue]
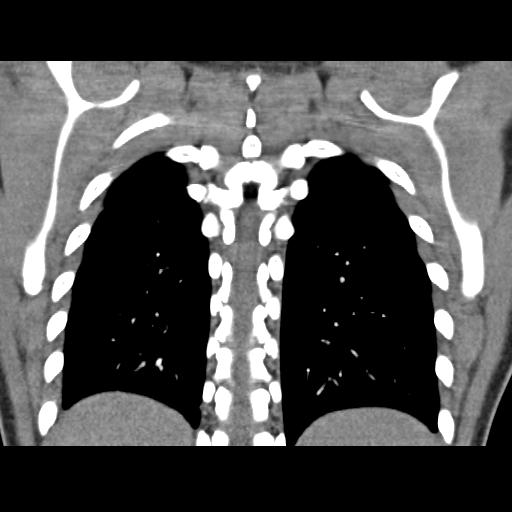
[im 42/83  soft-tissue]
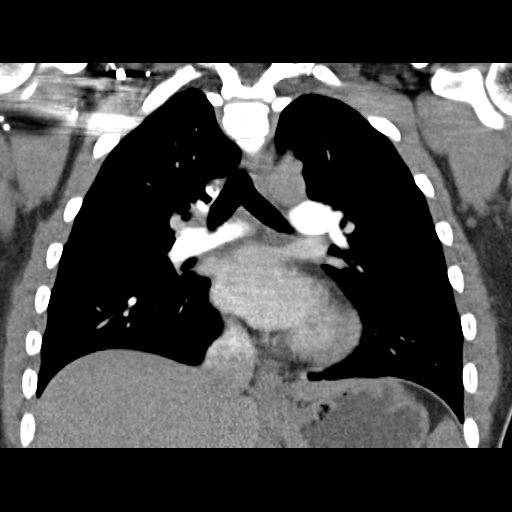
[im 62/83  soft-tissue]
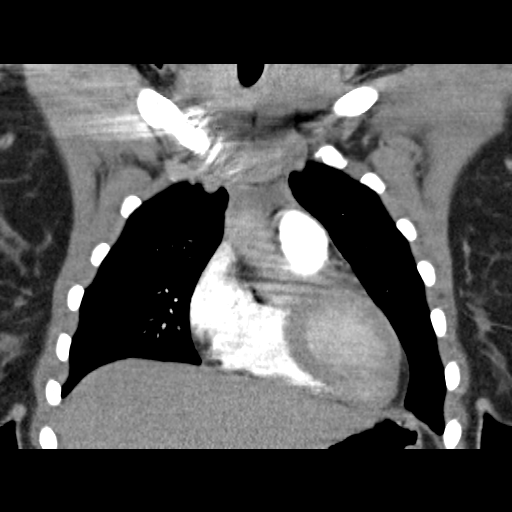

[19 of 46 positions shown; findings below may reference images not displayed]

FINDINGS: No evidence of pulmonary embolism.

The lungs are clear. No pleural effusion or pneumothorax.

Visualized thyroid is unremarkable.

Residual thymic tissue in the anterior mediastinum.

The heart is normal in size.  No pericardial effusion.

No suspicious mediastinal, hilar, or axillary lymphadenopathy.

Visualized upper abdomen is unremarkable.

Visualized osseous structures are within normal limits.
IMPRESSION: No evidence of pulmonary embolism.

Normal CT chest.

## 2013-10-03 ENCOUNTER — Emergency Department: Payer: Self-pay | Admitting: Emergency Medicine

## 2013-12-27 ENCOUNTER — Emergency Department: Payer: Self-pay | Admitting: Emergency Medicine

## 2013-12-27 LAB — URINALYSIS, COMPLETE
BILIRUBIN, UR: NEGATIVE
BLOOD: NEGATIVE
GLUCOSE, UR: NEGATIVE mg/dL (ref 0–75)
LEUKOCYTE ESTERASE: NEGATIVE
Nitrite: NEGATIVE
PH: 6 (ref 4.5–8.0)
PROTEIN: NEGATIVE
RBC,UR: 3 /HPF (ref 0–5)
SPECIFIC GRAVITY: 1.027 (ref 1.003–1.030)
Squamous Epithelial: 6
WBC UR: 5 /HPF (ref 0–5)

## 2013-12-27 LAB — CBC WITH DIFFERENTIAL/PLATELET
BASOS ABS: 0.1 10*3/uL (ref 0.0–0.1)
Basophil %: 0.9 %
EOS PCT: 0.5 %
Eosinophil #: 0 10*3/uL (ref 0.0–0.7)
HCT: 39 % (ref 35.0–47.0)
HGB: 12.2 g/dL (ref 12.0–16.0)
LYMPHS PCT: 29.5 %
Lymphocyte #: 2.9 10*3/uL (ref 1.0–3.6)
MCH: 24.2 pg — AB (ref 26.0–34.0)
MCHC: 31.4 g/dL — ABNORMAL LOW (ref 32.0–36.0)
MCV: 77 fL — ABNORMAL LOW (ref 80–100)
MONO ABS: 0.7 x10 3/mm (ref 0.2–0.9)
MONOS PCT: 7.5 %
NEUTROS ABS: 6 10*3/uL (ref 1.4–6.5)
NEUTROS PCT: 61.6 %
Platelet: 290 10*3/uL (ref 150–440)
RBC: 5.06 10*6/uL (ref 3.80–5.20)
RDW: 15.7 % — ABNORMAL HIGH (ref 11.5–14.5)
WBC: 9.7 10*3/uL (ref 3.6–11.0)

## 2013-12-27 LAB — COMPREHENSIVE METABOLIC PANEL
ALBUMIN: 4.1 g/dL (ref 3.4–5.0)
ALK PHOS: 60 U/L
ANION GAP: 6 — AB (ref 7–16)
BILIRUBIN TOTAL: 0.3 mg/dL (ref 0.2–1.0)
BUN: 16 mg/dL (ref 7–18)
CALCIUM: 9 mg/dL (ref 8.5–10.1)
CREATININE: 0.65 mg/dL (ref 0.60–1.30)
Chloride: 104 mmol/L (ref 98–107)
Co2: 29 mmol/L (ref 21–32)
Glucose: 82 mg/dL (ref 65–99)
Osmolality: 278 (ref 275–301)
POTASSIUM: 3.6 mmol/L (ref 3.5–5.1)
SGOT(AST): 32 U/L (ref 15–37)
SGPT (ALT): 19 U/L
Sodium: 139 mmol/L (ref 136–145)
Total Protein: 8.3 g/dL — ABNORMAL HIGH (ref 6.4–8.2)

## 2013-12-27 LAB — WET PREP, GENITAL

## 2013-12-27 LAB — GC/CHLAMYDIA PROBE AMP

## 2013-12-27 LAB — LIPASE, BLOOD: Lipase: 103 U/L (ref 73–393)

## 2014-01-15 ENCOUNTER — Emergency Department: Payer: Self-pay | Admitting: Emergency Medicine

## 2014-01-15 LAB — URINALYSIS, COMPLETE
BILIRUBIN, UR: NEGATIVE
Bacteria: NONE SEEN
GLUCOSE, UR: NEGATIVE mg/dL (ref 0–75)
KETONE: NEGATIVE
LEUKOCYTE ESTERASE: NEGATIVE
Nitrite: NEGATIVE
PH: 6 (ref 4.5–8.0)
Protein: NEGATIVE
RBC, UR: NONE SEEN /HPF (ref 0–5)
Specific Gravity: 1.012 (ref 1.003–1.030)
Squamous Epithelial: 2
WBC UR: 1 /HPF (ref 0–5)

## 2014-01-15 LAB — PREGNANCY, URINE: Pregnancy Test, Urine: NEGATIVE m[IU]/mL

## 2014-02-24 ENCOUNTER — Emergency Department: Payer: Self-pay | Admitting: Emergency Medicine

## 2014-02-24 LAB — URINALYSIS, COMPLETE
BILIRUBIN, UR: NEGATIVE
Bacteria: NONE SEEN
Blood: NEGATIVE
Glucose,UR: NEGATIVE mg/dL (ref 0–75)
KETONE: NEGATIVE
Leukocyte Esterase: NEGATIVE
NITRITE: NEGATIVE
PH: 7 (ref 4.5–8.0)
PROTEIN: NEGATIVE
Specific Gravity: 1.017 (ref 1.003–1.030)
Squamous Epithelial: 4
WBC UR: 1 /HPF (ref 0–5)

## 2014-02-24 LAB — WET PREP, GENITAL

## 2014-02-24 LAB — GC/CHLAMYDIA PROBE AMP

## 2014-04-16 ENCOUNTER — Emergency Department: Payer: Self-pay | Admitting: Emergency Medicine

## 2014-04-16 LAB — CBC WITH DIFFERENTIAL/PLATELET
BASOS ABS: 0.1 10*3/uL (ref 0.0–0.1)
BASOS PCT: 1.2 %
EOS PCT: 0.8 %
Eosinophil #: 0.1 10*3/uL (ref 0.0–0.7)
HCT: 42.5 % (ref 35.0–47.0)
HGB: 13.2 g/dL (ref 12.0–16.0)
LYMPHS ABS: 2.3 10*3/uL (ref 1.0–3.6)
Lymphocyte %: 22.4 %
MCH: 23.9 pg — AB (ref 26.0–34.0)
MCHC: 31.1 g/dL — AB (ref 32.0–36.0)
MCV: 77 fL — ABNORMAL LOW (ref 80–100)
MONO ABS: 0.8 x10 3/mm (ref 0.2–0.9)
Monocyte %: 7.4 %
Neutrophil #: 7 10*3/uL — ABNORMAL HIGH (ref 1.4–6.5)
Neutrophil %: 68.2 %
Platelet: 299 10*3/uL (ref 150–440)
RBC: 5.54 10*6/uL — ABNORMAL HIGH (ref 3.80–5.20)
RDW: 20.1 % — ABNORMAL HIGH (ref 11.5–14.5)
WBC: 10.3 10*3/uL (ref 3.6–11.0)

## 2014-04-16 LAB — URINALYSIS, COMPLETE
BILIRUBIN, UR: NEGATIVE
BLOOD: NEGATIVE
Bacteria: NONE SEEN
Glucose,UR: NEGATIVE mg/dL (ref 0–75)
KETONE: NEGATIVE
Leukocyte Esterase: NEGATIVE
NITRITE: NEGATIVE
PH: 6 (ref 4.5–8.0)
PROTEIN: NEGATIVE
RBC,UR: 2 /HPF (ref 0–5)
Specific Gravity: 1.021 (ref 1.003–1.030)
Squamous Epithelial: 5
WBC UR: 1 /HPF (ref 0–5)

## 2014-04-16 LAB — COMPREHENSIVE METABOLIC PANEL
ALT: 19 U/L
AST: 24 U/L (ref 15–37)
Albumin: 3.8 g/dL (ref 3.4–5.0)
Alkaline Phosphatase: 60 U/L
Anion Gap: 6 — ABNORMAL LOW (ref 7–16)
BUN: 7 mg/dL (ref 7–18)
Bilirubin,Total: 0.3 mg/dL (ref 0.2–1.0)
CALCIUM: 9 mg/dL (ref 8.5–10.1)
CO2: 29 mmol/L (ref 21–32)
CREATININE: 0.82 mg/dL (ref 0.60–1.30)
Chloride: 103 mmol/L (ref 98–107)
Glucose: 71 mg/dL (ref 65–99)
Osmolality: 272 (ref 275–301)
POTASSIUM: 3.7 mmol/L (ref 3.5–5.1)
SODIUM: 138 mmol/L (ref 136–145)
Total Protein: 8.4 g/dL — ABNORMAL HIGH (ref 6.4–8.2)

## 2014-04-16 LAB — LIPASE, BLOOD: Lipase: 90 U/L (ref 73–393)

## 2014-04-16 LAB — WET PREP, GENITAL

## 2014-04-16 LAB — GC/CHLAMYDIA PROBE AMP

## 2014-09-13 ENCOUNTER — Emergency Department
Admission: EM | Admit: 2014-09-13 | Discharge: 2014-09-13 | Disposition: A | Discharge status: Home / Self Care | Payer: Self-pay | Attending: Emergency Medicine | Admitting: Emergency Medicine

## 2014-09-13 DIAGNOSIS — B3731 Acute candidiasis of vulva and vagina: Secondary | ICD-10-CM

## 2014-09-13 DIAGNOSIS — B373 Candidiasis of vulva and vagina: Secondary | ICD-10-CM | POA: Insufficient documentation

## 2014-09-13 DIAGNOSIS — J029 Acute pharyngitis, unspecified: Secondary | ICD-10-CM | POA: Insufficient documentation

## 2014-09-13 DIAGNOSIS — Z3202 Encounter for pregnancy test, result negative: Secondary | ICD-10-CM | POA: Insufficient documentation

## 2014-09-13 DIAGNOSIS — Z793 Long term (current) use of hormonal contraceptives: Secondary | ICD-10-CM | POA: Insufficient documentation

## 2014-09-13 LAB — CHLAMYDIA/NGC RT PCR (ARMC ONLY)
Chlamydia Tr: NOT DETECTED
N GONORRHOEAE: NOT DETECTED

## 2014-09-13 LAB — URINALYSIS COMPLETE WITH MICROSCOPIC (ARMC ONLY)
BILIRUBIN URINE: NEGATIVE
GLUCOSE, UA: NEGATIVE mg/dL
HGB URINE DIPSTICK: NEGATIVE
Ketones, ur: NEGATIVE mg/dL
NITRITE: NEGATIVE
PROTEIN: NEGATIVE mg/dL
SPECIFIC GRAVITY, URINE: 1.023 (ref 1.005–1.030)
pH: 6 (ref 5.0–8.0)

## 2014-09-13 LAB — WET PREP, GENITAL
Clue Cells Wet Prep HPF POC: NONE SEEN
Trich, Wet Prep: NONE SEEN

## 2014-09-13 LAB — POCT PREGNANCY, URINE: Preg Test, Ur: NEGATIVE

## 2014-09-13 MED ORDER — AMOXICILLIN 500 MG PO CAPS
ORAL_CAPSULE | ORAL | Status: AC
  Filled 2014-09-13: qty 1

## 2014-09-13 MED ORDER — AMOXICILLIN 500 MG PO TABS: 500.0000 mg | ORAL_TABLET | Freq: Three times a day (TID) | ORAL | Status: DC

## 2014-09-13 MED ORDER — FLUCONAZOLE 50 MG PO TABS
150.0000 mg | ORAL_TABLET | Freq: Every day | ORAL | Status: DC
  Administered 2014-09-13: 150 mg via ORAL

## 2014-09-13 MED ORDER — AMOXICILLIN 500 MG PO CAPS
500.0000 mg | ORAL_CAPSULE | Freq: Once | ORAL | Status: AC
  Administered 2014-09-13: 500 mg via ORAL

## 2014-09-13 MED ORDER — FLUCONAZOLE 50 MG PO TABS
ORAL_TABLET | ORAL | Status: AC
  Filled 2014-09-13: qty 1

## 2014-09-13 MED ORDER — FLUCONAZOLE 150 MG PO TABS: 150.0000 mg | ORAL_TABLET | Freq: Once | ORAL | Status: DC

## 2014-09-13 MED ORDER — FLUCONAZOLE 100 MG PO TABS
ORAL_TABLET | ORAL | Status: AC
  Filled 2014-09-13: qty 1

## 2014-09-13 NOTE — Discharge Instructions (Signed)

## 2014-09-13 NOTE — ED Provider Notes (Signed)
[facility] Emergency Department Provider Note  ____________________________________________  Time seen: 22  I have reviewed the triage vital signs and the nursing notes.   HISTORY  Chief Complaint Sore Throat    HPI Teresa Cooper is a 24 y.o. female 's here today with a multiple day history of worsening sore throat as well as which she believes to be a vaginal yeast infection or vaginal dischargerates her overall pain in her throat is about 7 out of 10 burning type pain nothing making it better or worse states that she has no frank vaginal discharge just and itching and discomfort which is similar to previous [place] infections denies any other symptoms at this time   Past Medical History  Diagnosis Date  . Medical history non-contributory     Patient Active Problem List   Diagnosis Date Noted  . Pelvic adhesions 10/17/2012    Class: Present on Admission  . Endometriosis 10/17/2012    Class: Present on Admission    Past Surgical History  Procedure Laterality Date  . No past surgeries    . Laparoscopic lysis of adhesions N/A 10/17/2012    Procedure: LAPAROSCOPIC LYSIS OF ADHESIONS;  Surgeon: Darlyn Chamber, MD;  Location: [facility];  Service: Gynecology;  Laterality: N/A;  . Laparoscopy N/A 10/17/2012    Procedure: LAPAROSCOPY OPERATIVE;  Surgeon: Darlyn Chamber, MD;  Location: [facility];  Service: Gynecology;  Laterality: N/A;  . Chromopertubation N/A 10/17/2012    Procedure: CHROMOPERTUBATION;  Surgeon: Darlyn Chamber, MD;  Location: [facility];  Service: Gynecology;  Laterality: N/A;  attempted    Current Outpatient Rx  Name  Route  Sig  Dispense  Refill  . amoxicillin (AMOXIL) 500 MG tablet   Oral   Take 1 tablet (500 mg total) by mouth 3 (three) times daily.   21 tablet   0   . fluconazole (DIFLUCAN) 150 MG tablet   Oral   Take 1 tablet (150 mg total) by mouth once. In seven days for continued symptoms   1 tablet   0   . Norethin Ace-Eth  Estrad-FE (MINASTRIN 24 FE) 1-20 MG-MCG(24) CHEW   Oral   Chew 1 tablet by mouth daily.           Allergies Review of patient's allergies indicates no known allergies.  No family history on file.  Social History History  Substance Use Topics  . Smoking status: Never Smoker   . Smokeless tobacco: Not on file  . Alcohol Use: No    Review of Systems Constitutional: No fever/chills Eyes: No visual changes. ENT: No sore throat. Cardiovascular: Denies chest pain. Respiratory: Denies shortness of breath. Gastrointestinal: No abdominal pain.  No nausea, no vomiting.  No diarrhea.  No constipation. Genitourinary: Negative for dysuria. Musculoskeletal: Negative for back pain. Skin: Negative for rash. Neurological: Negative for headaches, focal weakness or numbness.  10-point ROS otherwise negative.  ____________________________________________   PHYSICAL EXAM:  VITAL SIGNS: ED Triage Vitals  Enc Vitals Group     BP 09/13/14 0942 119/78 mmHg     Pulse Rate 09/13/14 0942 106     Resp --      Temp 09/13/14 0942 98 F (36.7 C)     Temp Source 09/13/14 0942 Oral     SpO2 09/13/14 0942 98 %     Weight 09/13/14 0942 138 lb (62.596 kg)     Height 09/13/14 0942 5\' 1"  (1.549 m)     Head Cir --  Peak Flow --      Pain Score 09/13/14 1017 5     Pain Loc --      Pain Edu? --      Excl. in [place]? --     Constitutional: Alert and oriented. Well appearing and in no acute distress. Eyes: Conjunctivae are normal. PERRL. EOMI. Head: Atraumatic. Nose: No congestion/rhinnorhea. Mouth/Throat: Mucous membranes are moist.  Oropharynx erythematous. +1 tonsils exudate Neck: No stridor.   Hematological/Lymphatic/Immunilogical: cervical lymphadenopathy. Cardiovascular: Normal rate, regular rhythm. Grossly normal heart sounds.  Good peripheral circulation. Respiratory: Normal respiratory effort.  No retractions. Lungs CTAB. Gastrointestinal: Soft and nontender. No distention. No  abdominal bruits. No CVA tenderness. Genitourinary: She has a light white vaginal discharge normal bimanual external genitalia normal Musculoskeletal: No lower extremity tenderness nor edema.  No joint effusions. Neurologic:  Normal speech and language. No gross focal neurologic deficits are appreciated. Speech is normal. No gait instability. Skin:  Skin is warm, dry and intact. No rash noted. Psychiatric: Mood and affect are normal. Speech and behavior are normal.  ____________________________________________   LABS (all labs ordered are listed, but only abnormal results are displayed)  Labs Reviewed  WET PREP, GENITAL - Abnormal; Notable for the following:    Yeast Wet Prep HPF POC FEW (*)    WBC, Wet Prep HPF POC FEW (*)    All other components within normal limits  URINALYSIS COMPLETEWITH MICROSCOPIC ([facility] ONLY) - Abnormal; Notable for the following:    Color, Urine YELLOW (*)    APPearance CLOUDY (*)    Leukocytes, UA TRACE (*)    Bacteria, UA RARE (*)    Squamous Epithelial / LPF TOO NUMEROUS TO COUNT (*)    All other components within normal limits  CHLAMYDIA/NGC RT PCR ([facility] ONLY)  CHLAMYDIA/NGC RT PCR ([facility] ONLY)  POC URINE PREG, ED   ____________________________________________   PROCEDURES  Procedure(s) performed: None  Critical Care performed: No  ____________________________________________   INITIAL IMPRESSION / ASSESSMENT AND PLAN / ED COURSE  Pertinent labs & imaging results that were available during my care of the patient were reviewed by me and considered in my medical decision making (see chart for details).  Initial impression on this patient exudative pharyngitis and yeast vaginitis start her on oral antibiotic she was given a Diflucan in the department a prescription for one to take if her symptoms persist ____________________________________________   FINAL CLINICAL IMPRESSION(S) / ED DIAGNOSES  Final diagnoses:  Acute  pharyngitis, unspecified pharyngitis type  Yeast vaginitis     Danikah Budzik Verdene Rio, PA-C 09/13/14 1237  Lisa Roca, MD 09/13/14 1529

## 2014-09-13 NOTE — ED Notes (Signed)
Pt c/o sore throat for the past 3 days.  

## 2015-02-06 ENCOUNTER — Emergency Department
Admission: EM | Admit: 2015-02-06 | Discharge: 2015-02-06 | Disposition: A | Discharge status: Home / Self Care | Payer: Self-pay | Attending: Emergency Medicine | Admitting: Emergency Medicine

## 2015-02-06 ENCOUNTER — Encounter: Payer: Self-pay | Admitting: Emergency Medicine

## 2015-02-06 DIAGNOSIS — Z3202 Encounter for pregnancy test, result negative: Secondary | ICD-10-CM | POA: Insufficient documentation

## 2015-02-06 DIAGNOSIS — N898 Other specified noninflammatory disorders of vagina: Secondary | ICD-10-CM | POA: Insufficient documentation

## 2015-02-06 LAB — URINALYSIS COMPLETE WITH MICROSCOPIC (ARMC ONLY)
Bacteria, UA: NONE SEEN
Bilirubin Urine: NEGATIVE
Glucose, UA: NEGATIVE mg/dL
Ketones, ur: NEGATIVE mg/dL
Leukocytes, UA: NEGATIVE
NITRITE: NEGATIVE
PH: 6 (ref 5.0–8.0)
PROTEIN: NEGATIVE mg/dL
SPECIFIC GRAVITY, URINE: 1.019 (ref 1.005–1.030)

## 2015-02-06 LAB — WET PREP, GENITAL
Clue Cells Wet Prep HPF POC: NONE SEEN
Trich, Wet Prep: NONE SEEN
Yeast Wet Prep HPF POC: NONE SEEN

## 2015-02-06 LAB — CHLAMYDIA/NGC RT PCR (ARMC ONLY)
CHLAMYDIA TR: NOT DETECTED
N GONORRHOEAE: NOT DETECTED

## 2015-02-06 LAB — POCT PREGNANCY, URINE: Preg Test, Ur: NEGATIVE

## 2015-02-06 MED ORDER — CEFTRIAXONE SODIUM 250 MG IJ SOLR
250.0000 mg | Freq: Once | INTRAMUSCULAR | Status: AC
  Administered 2015-02-06: 250 mg via INTRAMUSCULAR
  Filled 2015-02-06: qty 250

## 2015-02-06 MED ORDER — FLUCONAZOLE 100 MG PO TABS
ORAL_TABLET | ORAL | Status: AC
  Filled 2015-02-06: qty 1

## 2015-02-06 MED ORDER — AZITHROMYCIN 250 MG PO TABS
1000.0000 mg | ORAL_TABLET | Freq: Once | ORAL | Status: AC
  Administered 2015-02-06: 1000 mg via ORAL
  Filled 2015-02-06: qty 4

## 2015-02-06 MED ORDER — FLUCONAZOLE 50 MG PO TABS
ORAL_TABLET | ORAL | Status: AC
  Filled 2015-02-06: qty 1

## 2015-02-06 MED ORDER — FLUCONAZOLE 50 MG PO TABS
150.0000 mg | ORAL_TABLET | Freq: Once | ORAL | Status: AC
  Administered 2015-02-06: 150 mg via ORAL

## 2015-02-06 NOTE — ED Notes (Signed)
Pt to ed with c/o vaginal discharge x 2 weeks.  Pt reports yellow color and foul odor to the discharge.

## 2015-02-06 NOTE — Discharge Instructions (Signed)
Drink plenty of water. Continue to monitor symptoms.  Follow-up with your primary care physician this week for follow-up at [place] this week as discussed. See above for details. Return to the emergency room for fever, pain, new or worsening concerns.

## 2015-02-06 NOTE — ED Provider Notes (Signed)
[facility] Emergency Department Provider Note  ____________________________________________  Time seen: Approximately 8:37 AM  I have reviewed the triage vital signs and the nursing notes.   HISTORY  Chief Complaint Vaginal Discharge   HPI Teresa Cooper is a 24 y.o. female presents for complaints of vaginal discharge x 2 weeks. States tried over the counter yeast infection cream without relief. Denies abdominal pain, vaginal pain, dysuria, urinary changes, bowel changes. States discharge is whitish occasional yellowish color. States one sexual partner x 9 months, denies changes in sexual partners recently. Denies pain. Denies fevers. States similar in past with yeast infection. States did have chlamydia "years ago" treated for it, but states this is not like that. States does wanted to be tested for gonorrhea and chlamydia as "concerned for it".    Past Medical History  Diagnosis Date  . Medical history non-contributory     Patient Active Problem List   Diagnosis Date Noted  . Pelvic adhesions 10/17/2012    Class: Present on Admission  . Endometriosis 10/17/2012    Class: Present on Admission    Past Surgical History  Procedure Laterality Date  . No past surgeries    . Laparoscopic lysis of adhesions N/A 10/17/2012    Procedure: LAPAROSCOPIC LYSIS OF ADHESIONS;  Surgeon: Darlyn Chamber, MD;  Location: [facility];  Service: Gynecology;  Laterality: N/A;  . Laparoscopy N/A 10/17/2012    Procedure: LAPAROSCOPY OPERATIVE;  Surgeon: Darlyn Chamber, MD;  Location: [facility];  Service: Gynecology;  Laterality: N/A;  . Chromopertubation N/A 10/17/2012    Procedure: CHROMOPERTUBATION;  Surgeon: Darlyn Chamber, MD;  Location: [facility];  Service: Gynecology;  Laterality: N/A;  attempted    Current Outpatient Rx  Name  Route  Sig  Dispense  Refill  .           .           .             Allergies Review of patient's allergies indicates no known allergies.  History  reviewed. No pertinent family history.  Social History Social History  Substance Use Topics  . Smoking status: Never Smoker   . Smokeless tobacco: None  . Alcohol Use: No    Review of Systems Constitutional: No fever/chills Eyes: No visual changes. ENT: No sore throat. Cardiovascular: Denies chest pain. Respiratory: Denies shortness of breath. Gastrointestinal: No abdominal pain.  No nausea, no vomiting.  No diarrhea.  No constipation. Genitourinary: Negative for dysuria.vaginal discharge.  Musculoskeletal: Negative for back pain. Skin: Negative for rash. Neurological: Negative for headaches, focal weakness or numbness.  10-point ROS otherwise negative.  ____________________________________________   PHYSICAL EXAM:  VITAL SIGNS: ED Triage Vitals  Enc Vitals Group     BP 02/06/15 0711 113/62 mmHg     Pulse Rate 02/06/15 0711 84     Resp 02/06/15 0711 20     Temp 02/06/15 0711 98.2 F (36.8 C)     Temp Source 02/06/15 0711 Oral     SpO2 02/06/15 0711 99 %     Weight 02/06/15 0711 148 lb (67.132 kg)     Height 02/06/15 0711 5\' 2"  (1.575 m)     Head Cir --      Peak Flow --      Pain Score 02/06/15 0712 2     Pain Loc --      Pain Edu? --      Excl. in [place]? --  Constitutional: Alert and oriented. Well appearing and in no acute distress. Eyes: Conjunctivae are normal. PERRL. EOMI. Head: Atraumatic.  Nose: No congestion/rhinnorhea.  Mouth/Throat: Mucous membranes are moist.  Oropharynx non-erythematous. Neck: No stridor.  No cervical spine tenderness to palpation. Hematological/Lymphatic/Immunilogical: No cervical lymphadenopathy. Cardiovascular: Normal rate, regular rhythm. Grossly normal heart sounds.  Good peripheral circulation. Respiratory: Normal respiratory effort.  No retractions. Lungs CTAB. Gastrointestinal: Soft and nontender. No distention. Normal Bowel sounds.  No CVA tenderness. Pelvic: pelvic exam completed with RN Lana at bedside.  External:  normal appearance, no lesions. Speculum: mild whitish active discharge, no bleeding, cervix closed and with normal appearance. Bimanual: nontender, no cervical or adnexal tenderness.  Musculoskeletal: No lower or upper extremity tenderness nor edema. Neurologic:  Normal speech and language. No gross focal neurologic deficits are appreciated. No gait instability. Skin:  Skin is warm, dry and intact. No rash noted. Psychiatric: Mood and affect are normal. Speech and behavior are normal.  ____________________________________________   LABS (all labs ordered are listed, but only abnormal results are displayed)  Labs Reviewed  WET PREP, GENITAL - Abnormal; Notable for the following:    WBC, Wet Prep HPF POC FEW (*)    All other components within normal limits  URINALYSIS COMPLETEWITH MICROSCOPIC ([facility] ONLY) - Abnormal; Notable for the following:    Color, Urine YELLOW (*)    APPearance HAZY (*)    Hgb urine dipstick 1+ (*)    Squamous Epithelial / LPF 0-5 (*)    All other components within normal limits  CHLAMYDIA/NGC RT PCR ([facility] ONLY)  URINE CULTURE  POCT PREGNANCY, URINE    INITIAL IMPRESSION / ASSESSMENT AND PLAN / ED COURSE  Pertinent labs & imaging results that were available during my care of the patient were reviewed by me and considered in my medical decision making (see chart for details).  Very well-appearing patient. No acute distress. Presents today for vaginal discharge complaint 2 weeks. Denies pain. Denies changes in sexual partners but states that she is concerned for gonorrhea and chlamydia. Denies concerning for other STDs. States that she does not want to be tested for other STDs. Urinalysis negative for bacteria, will culture urine. Urine pregnancy negative. Wet prep negative for trichomoniasis, clue cells and yeast. Few WBCs on what prep present. Patient does reports that she is concerned for gonorrhea and chlamydia. Patient requests to go ahead and be  treated for gonorrhea and chlamydia prior to waiting for her testing to result. We'll go ahead and treat with oral azithromycin 1 g, Rocephin 250 mg IM 1 in ER. Diflucan 150 mg once, pt reports history of vaginal yeast infections with antibiotics. Discussed with patient to refrain from sexual activity until follow-up and cleared. Patient to follow-up with primary care physician or [facility] in one week. Discussed follow up with Primary care physician this week. Discussed follow up and return parameters including no resolution or any worsening concerns. Patient verbalized understanding and agreed to plan.    ____________________________________________   FINAL CLINICAL IMPRESSION(S) / ED DIAGNOSES  Final diagnoses:  Vaginal discharge       Marylene Land, NP 02/06/15 [place], NP 02/06/15 [place], MD 02/06/15 [phone]

## 2015-02-07 LAB — URINE CULTURE

## 2015-09-12 ENCOUNTER — Encounter: Payer: Self-pay | Admitting: Emergency Medicine

## 2015-09-12 ENCOUNTER — Emergency Department
Admission: EM | Admit: 2015-09-12 | Discharge: 2015-09-12 | Disposition: A | Discharge status: Home / Self Care | Payer: 59 | Attending: Emergency Medicine | Admitting: Emergency Medicine

## 2015-09-12 DIAGNOSIS — S61212A Laceration without foreign body of right middle finger without damage to nail, initial encounter: Secondary | ICD-10-CM | POA: Diagnosis present

## 2015-09-12 DIAGNOSIS — S61214A Laceration without foreign body of right ring finger without damage to nail, initial encounter: Secondary | ICD-10-CM | POA: Diagnosis not present

## 2015-09-12 DIAGNOSIS — Y929 Unspecified place or not applicable: Secondary | ICD-10-CM | POA: Diagnosis not present

## 2015-09-12 DIAGNOSIS — S61219A Laceration without foreign body of unspecified finger without damage to nail, initial encounter: Secondary | ICD-10-CM

## 2015-09-12 DIAGNOSIS — Y999 Unspecified external cause status: Secondary | ICD-10-CM | POA: Insufficient documentation

## 2015-09-12 DIAGNOSIS — W268XXA Contact with other sharp object(s), not elsewhere classified, initial encounter: Secondary | ICD-10-CM | POA: Diagnosis not present

## 2015-09-12 DIAGNOSIS — Y939 Activity, unspecified: Secondary | ICD-10-CM | POA: Diagnosis not present

## 2015-09-12 MED ORDER — LIDOCAINE HCL (PF) 1 % IJ SOLN
5.0000 mL | Freq: Once | INTRAMUSCULAR | Status: DC
  Filled 2015-09-12: qty 5

## 2015-09-12 NOTE — ED Notes (Signed)
Patient presents to the ED with lacerations to her forefinger and middle finger of her right hand.  Patient states she cut her fingers on a tin can.  Patient is in no obvious distress. Bleeding is controlled.

## 2015-09-12 NOTE — Discharge Instructions (Signed)
Laceration Care, Adult  A laceration is a cut that goes through all layers of the skin. The cut also goes into the tissue that is right under the skin. Some cuts heal on their own. Others need to be closed with stitches (sutures), staples, skin adhesive strips, or wound glue. Taking care of your cut lowers your risk of infection and helps your cut to heal better.  HOW TO TAKE CARE OF YOUR CUT  For stitches or staples:  · Keep the wound clean and dry.  · If you were given a bandage (dressing), you should change it at least one time per day or as told by your doctor. You should also change it if it gets wet or dirty.  · Keep the wound completely dry for the first 24 hours or as told by your doctor. After that time, you may take a shower or a bath. However, make sure that the wound is not soaked in water until after the stitches or staples have been removed.  · Clean the wound one time each day or as told by your doctor:    Wash the wound with soap and water.    Rinse the wound with water until all of the soap comes off.    Pat the wound dry with a clean towel. Do not rub the wound.  · After you clean the wound, put a thin layer of antibiotic ointment on it as told by your doctor. This ointment:    Helps to prevent infection.    Keeps the bandage from sticking to the wound.  · Have your stitches or staples removed as told by your doctor.  If your doctor used skin adhesive strips:   · Keep the wound clean and dry.  · If you were given a bandage, you should change it at least one time per day or as told by your doctor. You should also change it if it gets dirty or wet.  · Do not get the skin adhesive strips wet. You can take a shower or a bath, but be careful to keep the wound dry.  · If the wound gets wet, pat it dry with a clean towel. Do not rub the wound.  · Skin adhesive strips fall off on their own. You can trim the strips as the wound heals. Do not remove any strips that are still stuck to the wound. They will  fall off after a while.  If your doctor used wound glue:  · Try to keep your wound dry, but you may briefly wet it in the shower or bath. Do not soak the wound in water, such as by swimming.  · After you take a shower or a bath, gently pat the wound dry with a clean towel. Do not rub the wound.  · Do not do any activities that will make you really sweaty until the skin glue has fallen off on its own.  · Do not apply liquid, cream, or ointment medicine to your wound while the skin glue is still on.  · If you were given a bandage, you should change it at least one time per day or as told by your doctor. You should also change it if it gets dirty or wet.  · If a bandage is placed over the wound, do not let the tape for the bandage touch the skin glue.  · Do not pick at the glue. The skin glue usually stays on for 5-10 days. Then, it   or when wound glue stays in place and the wound is healed. Make sure to wear a sunscreen of at least 30 SPF.  Take over-the-counter and prescription medicines only as told by your doctor.  If you were given antibiotic medicine or ointment, take or apply it as told by your doctor. Do not stop using the antibiotic even if your wound is getting better.  Do not scratch or pick at the wound.  Keep all follow-up visits as told by your doctor. This is important.  Check your wound every day for signs of infection. Watch for:  Redness, swelling, or pain.  Fluid, blood, or pus.  Raise (elevate) the injured area above the level of your heart while you are sitting or lying down, if possible. GET HELP IF:  You got a tetanus shot and you have any of these problems at the injection site:  Swelling.  Very bad pain.  Redness.  Bleeding.  You have a fever.  A wound that was  closed breaks open.  You notice a bad smell coming from your wound or your bandage.  You notice something coming out of the wound, such as wood or glass.  Medicine does not help your pain.  You have more redness, swelling, or pain at the site of your wound.  You have fluid, blood, or pus coming from your wound.  You notice a change in the color of your skin near your wound.  You need to change the bandage often because fluid, blood, or pus is coming from the wound.  You start to have a new rash.  You start to have numbness around the wound. GET HELP RIGHT AWAY IF:  You have very bad swelling around the wound.  Your pain suddenly gets worse and is very bad.  You notice painful lumps near the wound or on skin that is anywhere on your body.  You have a red streak going away from your wound.  The wound is on your hand or foot and you cannot move a finger or toe like you usually can.  The wound is on your hand or foot and you notice that your fingers or toes look pale or bluish.   This information is not intended to replace advice given to you by your health care provider. Make sure you discuss any questions you have with your health care provider.   Document Released: 09/06/2007 Document Revised: 08/04/2014 Document Reviewed: 03/16/2014 Elsevier Interactive Patient Education 2016 [place] the wounds clean, dry, and covered. Follow-up with your provider or a local urgent care, like [facility] for suture removal in 7-10 days.

## 2015-09-12 NOTE — ED Provider Notes (Signed)
[facility] Emergency Department Provider Note ____________________________________________  Time seen: 1822  I have reviewed the triage vital signs and the nursing notes.  HISTORY  Chief Complaint  Extremity Laceration  HPI Teresa Cooper is a 25 y.o. female presents to the ED for evaluation of injury sustained when she actually cut her fingers on a 10 cans. She presents with laceration to the distal aspects of her right third and fourth fingertips. No active bleeding at this time. Patient reports tetanus status is current. She denies any other injury at this time.She reports the discomfort is 7/10 in triage.  Past Medical History  Diagnosis Date  . Medical history non-contributory     Patient Active Problem List   Diagnosis Date Noted  . Pelvic adhesions 10/17/2012    Class: Present on Admission  . Endometriosis 10/17/2012    Class: Present on Admission    Past Surgical History  Procedure Laterality Date  . No past surgeries    . Laparoscopic lysis of adhesions N/A 10/17/2012    Procedure: LAPAROSCOPIC LYSIS OF ADHESIONS;  Surgeon: Darlyn Chamber, MD;  Location: [facility];  Service: Gynecology;  Laterality: N/A;  . Laparoscopy N/A 10/17/2012    Procedure: LAPAROSCOPY OPERATIVE;  Surgeon: Darlyn Chamber, MD;  Location: [facility];  Service: Gynecology;  Laterality: N/A;  . Chromopertubation N/A 10/17/2012    Procedure: CHROMOPERTUBATION;  Surgeon: Darlyn Chamber, MD;  Location: [facility];  Service: Gynecology;  Laterality: N/A;  attempted    Current Outpatient Rx  Name  Route  Sig  Dispense  Refill  . amoxicillin (AMOXIL) 500 MG tablet   Oral   Take 1 tablet (500 mg total) by mouth 3 (three) times daily.   21 tablet   0   . fluconazole (DIFLUCAN) 150 MG tablet   Oral   Take 1 tablet (150 mg total) by mouth once. In seven days for continued symptoms   1 tablet   0   . Norethin Ace-Eth Estrad-FE (MINASTRIN 24 FE) 1-20 MG-MCG(24) CHEW   Oral   Chew 1  tablet by mouth daily.           Allergies Review of patient's allergies indicates no known allergies.  No family history on file.  Social History Social History  Substance Use Topics  . Smoking status: Never Smoker   . Smokeless tobacco: None  . Alcohol Use: No    Review of Systems  Constitutional: Negative for fever. Musculoskeletal: Negative for back pain. Skin: Negative for rash. Finger lacerations as above Neurological: Negative for headaches, focal weakness or numbness. ____________________________________________  PHYSICAL EXAM:  VITAL SIGNS: ED Triage Vitals  Enc Vitals Group     BP 09/12/15 1710 123/74 mmHg     Pulse Rate 09/12/15 1710 95     Resp 09/12/15 1710 18     Temp 09/12/15 1710 98.3 F (36.8 C)     Temp src --      SpO2 09/12/15 1710 98 %     Weight 09/12/15 1710 147 lb (66.679 kg)     Height 09/12/15 1710 5\' 7"  (1.702 m)     Head Cir --      Peak Flow --      Pain Score 09/12/15 1710 7     Pain Loc --      Pain Edu? --      Excl. in [place]? --     Constitutional: Alert and oriented. Well appearing and in no distress. Head: Normocephalic  and atraumatic. Cardiovascular: Normal distal pulses and cap refill.  Respiratory: Normal respiratory effort.  Musculoskeletal: Nontender with normal range of motion in all extremities. Normal composite fists.  Neurologic:  Normal gait without ataxia. Normal speech and language. No gross focal neurologic deficits are appreciated. Skin:  Skin is warm, dry and intact. No rash noted. Right index finger with a  superficial flap lac to the distal phalanx. Right middle finger with a  Lac over the middle phalanx.  ____________________________________________  PROCEDURES  LACERATION REPAIR Performed by: Melvenia Needles Authorized by: Melvenia Needles Consent: Verbal consent obtained. Risks and benefits: risks, benefits and alternatives were discussed Consent given by: patient Patient identity  confirmed: provided demographic data Prepped and Draped in normal sterile fashion Wound explored  Laceration Location: right index & middle fingers  Laceration Length: 1 cm & 1.5 cm  No Foreign Bodies seen or palpated  Anesthesia: thecal infiltration  Local anesthetic: lidocaine 1% w/o epinephrine  Anesthetic total: 1 ml  Irrigation method: syringe Amount of cleaning: standard  Skin closure: Right Index: cyanoacrylate wound adhesive                        Right Middle : 5-0 nylon  Number of sutures: 3  Technique: interrupted  Patient tolerance: Patient tolerated the procedure well with no immediate complications. ____________________________________________  INITIAL IMPRESSION / ASSESSMENT AND PLAN / ED COURSE  Patient with laceration repair to the right index and middle fingers using wound adhesive and sutures, respectively. She is discharged with reduction and in encouraged to follow with her primary care provider, local urgent care, or [facility] in 7-10 days for suture removal. ____________________________________________  FINAL CLINICAL IMPRESSION(S) / ED DIAGNOSES  Final diagnoses:  Finger laceration, initial encounter     Melvenia Needles, PA-C 09/12/15 1949  Delman Kitten, MD 09/12/15 2344

## 2016-03-03 ENCOUNTER — Encounter: Payer: Self-pay | Admitting: Emergency Medicine

## 2016-03-03 ENCOUNTER — Emergency Department
Admission: EM | Admit: 2016-03-03 | Discharge: 2016-03-03 | Disposition: A | Discharge status: Home / Self Care | Payer: 59 | Attending: Emergency Medicine | Admitting: Emergency Medicine

## 2016-03-03 DIAGNOSIS — R102 Pelvic and perineal pain: Secondary | ICD-10-CM | POA: Diagnosis present

## 2016-03-03 DIAGNOSIS — N898 Other specified noninflammatory disorders of vagina: Secondary | ICD-10-CM | POA: Insufficient documentation

## 2016-03-03 DIAGNOSIS — Z79899 Other long term (current) drug therapy: Secondary | ICD-10-CM | POA: Insufficient documentation

## 2016-03-03 DIAGNOSIS — R109 Unspecified abdominal pain: Secondary | ICD-10-CM

## 2016-03-03 LAB — COMPREHENSIVE METABOLIC PANEL
ALBUMIN: 3.9 g/dL (ref 3.5–5.0)
ALT: 10 U/L — AB (ref 14–54)
AST: 20 U/L (ref 15–41)
Alkaline Phosphatase: 41 U/L (ref 38–126)
Anion gap: 5 (ref 5–15)
BUN: 10 mg/dL (ref 6–20)
CHLORIDE: 107 mmol/L (ref 101–111)
CO2: 24 mmol/L (ref 22–32)
CREATININE: 0.64 mg/dL (ref 0.44–1.00)
Calcium: 8.8 mg/dL — ABNORMAL LOW (ref 8.9–10.3)
GFR calc non Af Amer: >60 mL/min (ref >60)
Glucose, Bld: 92 mg/dL (ref 65–99)
Potassium: 3.6 mmol/L (ref 3.5–5.1)
SODIUM: 136 mmol/L (ref 135–145)
Total Bilirubin: 0.6 mg/dL (ref 0.3–1.2)
Total Protein: 7.7 g/dL (ref 6.5–8.1)

## 2016-03-03 LAB — URINALYSIS COMPLETE WITH MICROSCOPIC (ARMC ONLY)
Bacteria, UA: NONE SEEN
Bilirubin Urine: NEGATIVE
Glucose, UA: NEGATIVE mg/dL
Hgb urine dipstick: NEGATIVE
Ketones, ur: NEGATIVE mg/dL
Nitrite: NEGATIVE
Protein, ur: 30 mg/dL — AB
Specific Gravity, Urine: 1.025 (ref 1.005–1.030)
pH: 5 (ref 5.0–8.0)

## 2016-03-03 LAB — LIPASE, BLOOD: LIPASE: 19 U/L (ref 11–51)

## 2016-03-03 LAB — CHLAMYDIA/NGC RT PCR (ARMC ONLY)
CHLAMYDIA TR: NOT DETECTED
N gonorrhoeae: NOT DETECTED

## 2016-03-03 LAB — CBC
HCT: 29.5 % — ABNORMAL LOW (ref 35.0–47.0)
Hemoglobin: 9.3 g/dL — ABNORMAL LOW (ref 12.0–16.0)
MCH: 20.2 pg — ABNORMAL LOW (ref 26.0–34.0)
MCHC: 31.5 g/dL — ABNORMAL LOW (ref 32.0–36.0)
MCV: 64.1 fL — AB (ref 80.0–100.0)
Platelets: 221 10*3/uL (ref 150–440)
RBC: 4.61 MIL/uL (ref 3.80–5.20)
RDW: 17.8 % — ABNORMAL HIGH (ref 11.5–14.5)
WBC: 6.9 10*3/uL (ref 3.6–11.0)

## 2016-03-03 LAB — WET PREP, GENITAL
Clue Cells Wet Prep HPF POC: NONE SEEN
Sperm: NONE SEEN
Trich, Wet Prep: NONE SEEN
Yeast Wet Prep HPF POC: NONE SEEN

## 2016-03-03 LAB — RAPID HIV SCREEN (HIV 1/2 AB+AG)
HIV 1/2 ANTIBODIES: NONREACTIVE
HIV-1 P24 Antigen - HIV24: NONREACTIVE

## 2016-03-03 LAB — POCT PREGNANCY, URINE: Preg Test, Ur: NEGATIVE

## 2016-03-03 NOTE — ED Triage Notes (Signed)
Pt reports lower abdominal pain for two weeks. Pt states has had some nausea but denies vomiting or diarrhea. Pt denies dysuria. Pt denies fever at home. Pt ambulatory to triage, no apparent distress noted.

## 2016-03-03 NOTE — ED Provider Notes (Signed)
[facility] Emergency Department Provider Note  ____________________________________________   I have reviewed the triage vital signs and the nursing notes.   HISTORY  Chief Complaint Abdominal Pain    HPI Teresa Cooper is a 25 y.o. female who presents today complaining of vaginal discharge and occasional pelvic pain for last 2 weeks. Denies fever or chills per not having the pain right now. Denies nausea or vomiting. Aspirin and see. Has no other associated symptoms.     Past Medical History:  Diagnosis Date  . Medical history non-contributory     Patient Active Problem List   Diagnosis Date Noted  . Pelvic adhesions 10/17/2012    Class: Present on Admission  . Endometriosis 10/17/2012    Class: Present on Admission    Past Surgical History:  Procedure Laterality Date  . CHROMOPERTUBATION N/A 10/17/2012   Procedure: CHROMOPERTUBATION;  Surgeon: Darlyn Chamber, MD;  Location: [facility] [facility];  Service: Gynecology;  Laterality: N/A;  attempted  . LAPAROSCOPIC LYSIS OF ADHESIONS N/A 10/17/2012   Procedure: LAPAROSCOPIC LYSIS OF ADHESIONS;  Surgeon: Darlyn Chamber, MD;  Location: [place] [facility];  Service: Gynecology;  Laterality: N/A;  . LAPAROSCOPY N/A 10/17/2012   Procedure: LAPAROSCOPY OPERATIVE;  Surgeon: Darlyn Chamber, MD;  Location: [place] [facility];  Service: Gynecology;  Laterality: N/A;  . NO PAST SURGERIES      Prior to Admission medications   Medication Sig Start Date End Date Taking? Authorizing Provider  amoxicillin (AMOXIL) 500 MG tablet Take 1 tablet (500 mg total) by mouth 3 (three) times daily. 09/13/14   III William C Ruffian, PA-C  fluconazole (DIFLUCAN) 150 MG tablet Take 1 tablet (150 mg total) by mouth once. In seven days for continued symptoms 09/13/14   III William C Ruffian, PA-C  Norethin Ace-Eth Estrad-FE (MINASTRIN 24 FE) 1-20 MG-MCG(24) CHEW Chew 1 tablet by mouth daily.    Historical Provider, MD    Allergies Patient has no known allergies.  No  family history on file.  Social History Social History  Substance Use Topics  . Smoking status: Never Smoker  . Smokeless tobacco: Not on file  . Alcohol use No    Review of Systems Constitutional: No fever/chills Eyes: No visual changes. ENT: No sore throat. No stiff neck no neck pain Cardiovascular: Denies chest pain. Respiratory: Denies shortness of breath. Gastrointestinal:   no vomiting.  No diarrhea.  No constipation. Genitourinary: Negative for dysuria. Musculoskeletal: Negative lower extremity swelling Skin: Negative for rash. Neurological: Negative for severe headaches, focal weakness or numbness. 10-point ROS otherwise negative.  ____________________________________________   PHYSICAL EXAM:  VITAL SIGNS: ED Triage Vitals [03/03/16 1109]  Enc Vitals Group     BP 113/69     Pulse Rate 94     Resp 18     Temp 98 F (36.7 C)     Temp Source Oral     SpO2 100 %     Weight 160 lb (72.6 kg)     Height 5\' 2"  (1.575 m)     Head Circumference      Peak Flow      Pain Score 6     Pain Loc      Pain Edu?      Excl. in [place]?     Constitutional: Alert and oriented. Well appearing and in no acute distress. Eyes: Conjunctivae are normal. PERRL. EOMI. Head: Atraumatic. Nose: No congestion/rhinnorhea. Mouth/Throat: Mucous membranes are moist.  Oropharynx non-erythematous. Neck: No stridor.   Nontender  with no meningismus Cardiovascular: Normal rate, regular rhythm. Grossly normal heart sounds.  Good peripheral circulation. Respiratory: Normal respiratory effort.  No retractions. Lungs CTAB. Abdominal: Soft and nontender. No distention. No guarding no rebound Back:  There is no focal tenderness or step off.  there is no midline tenderness there are no lesions noted. there is no CVA tenderness [id number] exam: Female nurse chaperone present, no external lesions noted, physiologic vaginal discharge noted with no purulent discharge, no cervical motion tenderness, no adnexal  tenderness or mass, there is no significant uterine tenderness or mass. No vaginal bleeding Musculoskeletal: No lower extremity tenderness, no upper extremity tenderness. No joint effusions, no DVT signs strong distal pulses no edema Neurologic:  Normal speech and language. No gross focal neurologic deficits are appreciated.  Skin:  Skin is warm, dry and intact. No rash noted. Psychiatric: Mood and affect are normal. Speech and behavior are normal.  ____________________________________________   LABS (all labs ordered are listed, but only abnormal results are displayed)  Labs Reviewed  WET PREP, GENITAL - Abnormal; Notable for the following:       Result Value   WBC, Wet Prep HPF POC FEW (*)    All other components within normal limits  COMPREHENSIVE METABOLIC PANEL - Abnormal; Notable for the following:    Calcium 8.8 (*)    ALT 10 (*)    All other components within normal limits  CBC - Abnormal; Notable for the following:    Hemoglobin 9.3 (*)    HCT 29.5 (*)    MCV 64.1 (*)    MCH 20.2 (*)    MCHC 31.5 (*)    RDW 17.8 (*)    All other components within normal limits  URINALYSIS COMPLETEWITH MICROSCOPIC ([facility] ONLY) - Abnormal; Notable for the following:    Color, Urine YELLOW (*)    APPearance CLOUDY (*)    Protein, ur 30 (*)    Leukocytes, UA TRACE (*)    Squamous Epithelial / LPF 6-30 (*)    All other components within normal limits  CHLAMYDIA/NGC RT PCR ([facility] ONLY)  LIPASE, BLOOD  RAPID HIV SCREEN (HIV 1/2 AB+AG)  RPR  POCT PREGNANCY, URINE   ____________________________________________  EKG  I personally interpreted any EKGs ordered by me or triage  ____________________________________________  RADIOLOGY  I reviewed any imaging ordered by me or triage that were performed during my shift and, if possible, patient and/or family made aware of any abnormal findings. ____________________________________________   PROCEDURES  Procedure(s) performed:  None  Procedures  Critical Care performed: None  ____________________________________________   INITIAL IMPRESSION / ASSESSMENT AND PLAN / ED COURSE  Pertinent labs & imaging results that were available during my care of the patient were reviewed by me and considered in my medical decision making (see chart for details).  I see no evidence of obvious sexually transmitted infection nor do I see any evidence of PID. Nor is there evidence of torsion, appendicitis, diverticulitis, or any other acute intra-abdominal pathology today. Patient would like to wait for her extremity to come back as she is or even here for most of the day. The labs tells me will be back in about 40 minutes. We do not normally do this because she is already been here for so long, and I see no evidence of PID or STI on her exam, I think empiric antibiotics would be not indicated and sending her home with her concerns I think would be anxiety provoking to her so we'll keep  her here for another half hour 40 minutes and then discharged once we know. Return precautions and follow-up given and understood.  Clinical Course    ____________________________________________   FINAL CLINICAL IMPRESSION(S) / ED DIAGNOSES  Final diagnoses:  None      This chart was dictated using voice recognition software.  Despite best efforts to proofread,  errors can occur which can change meaning.      Schuyler Amor, MD 03/03/16 [phone]

## 2016-03-03 NOTE — ED Notes (Signed)
Lab notified to add on rapid HIV and RPR

## 2016-03-04 LAB — RPR: RPR Ser Ql: NONREACTIVE

## 2017-10-14 ENCOUNTER — Other Ambulatory Visit: Payer: Self-pay

## 2017-10-14 ENCOUNTER — Emergency Department
Admission: EM | Admit: 2017-10-14 | Discharge: 2017-10-14 | Disposition: A | Discharge status: Home / Self Care | Payer: Self-pay | Attending: Emergency Medicine | Admitting: Emergency Medicine

## 2017-10-14 ENCOUNTER — Emergency Department: Payer: Self-pay

## 2017-10-14 DIAGNOSIS — Z79899 Other long term (current) drug therapy: Secondary | ICD-10-CM | POA: Insufficient documentation

## 2017-10-14 DIAGNOSIS — K5900 Constipation, unspecified: Secondary | ICD-10-CM | POA: Insufficient documentation

## 2017-10-14 DIAGNOSIS — R109 Unspecified abdominal pain: Secondary | ICD-10-CM

## 2017-10-14 LAB — COMPREHENSIVE METABOLIC PANEL
ALT: 10 U/L (ref 0–44)
AST: 16 U/L (ref 15–41)
Albumin: 3.6 g/dL (ref 3.5–5.0)
Alkaline Phosphatase: 39 U/L (ref 38–126)
Anion gap: 6 (ref 5–15)
BUN: 10 mg/dL (ref 6–20)
CHLORIDE: 105 mmol/L (ref 98–111)
CO2: 26 mmol/L (ref 22–32)
CREATININE: 0.71 mg/dL (ref 0.44–1.00)
Calcium: 8.8 mg/dL — ABNORMAL LOW (ref 8.9–10.3)
Glucose, Bld: 95 mg/dL (ref 70–99)
Potassium: 3.7 mmol/L (ref 3.5–5.1)
Sodium: 137 mmol/L (ref 135–145)
TOTAL PROTEIN: 7.4 g/dL (ref 6.5–8.1)
Total Bilirubin: 0.4 mg/dL (ref 0.3–1.2)

## 2017-10-14 LAB — URINALYSIS, COMPLETE (UACMP) WITH MICROSCOPIC
BILIRUBIN URINE: NEGATIVE
Bacteria, UA: NONE SEEN
GLUCOSE, UA: NEGATIVE mg/dL
HGB URINE DIPSTICK: NEGATIVE
KETONES UR: NEGATIVE mg/dL
NITRITE: NEGATIVE
PH: 7 (ref 5.0–8.0)
Protein, ur: NEGATIVE mg/dL
SPECIFIC GRAVITY, URINE: 1.015 (ref 1.005–1.030)

## 2017-10-14 LAB — CHLAMYDIA/NGC RT PCR (ARMC ONLY)
Chlamydia Tr: NOT DETECTED
N gonorrhoeae: NOT DETECTED

## 2017-10-14 LAB — LIPASE, BLOOD: LIPASE: 36 U/L (ref 11–51)

## 2017-10-14 LAB — CBC
HCT: 32.1 % — ABNORMAL LOW (ref 35.0–47.0)
Hemoglobin: 10.1 g/dL — ABNORMAL LOW (ref 12.0–16.0)
MCH: 21.8 pg — ABNORMAL LOW (ref 26.0–34.0)
MCHC: 31.5 g/dL — ABNORMAL LOW (ref 32.0–36.0)
MCV: 69.3 fL — AB (ref 80.0–100.0)
PLATELETS: 268 10*3/uL (ref 150–440)
RBC: 4.63 MIL/uL (ref 3.80–5.20)
RDW: 18.5 % — AB (ref 11.5–14.5)
WBC: 8.3 10*3/uL (ref 3.6–11.0)

## 2017-10-14 LAB — WET PREP, GENITAL
CLUE CELLS WET PREP: NONE SEEN
SPERM: NONE SEEN
TRICH WET PREP: NONE SEEN
YEAST WET PREP: NONE SEEN

## 2017-10-14 LAB — PREGNANCY, URINE: Preg Test, Ur: NEGATIVE

## 2017-10-14 NOTE — Discharge Instructions (Signed)
Return to the emergency room for any new or worsening symptoms including increased pain, vomiting, fever, or if you feel worse in any way.  Blood work is reassuring vital signs are reassuring, and your pelvic exam is reassuring.  Please follow closely with your OB/GYN.  If you have increased pain vomiting or fever you feel worse return immediately.  Otherwise, take Tylenol or Motrin for the discomfort.  He did report constipation, we do asked that you try over-the-counter things such as MiraLAX as prescribed to get yourself feeling better

## 2017-10-14 NOTE — ED Triage Notes (Signed)
Patient reports right lower quad abdominal pain "since last week" (patient could not be more specific).  Reports +nausea, denies urinary symptoms.

## 2017-10-14 NOTE — ED Provider Notes (Addendum)
[facility] Emergency Department Provider Note  ____________________________________________   I have reviewed the triage vital signs and the nursing notes. Where available I have reviewed prior notes and, if possible and indicated, outside hospital notes.    HISTORY  Chief Complaint Abdominal Pain    HPI Teresa Cooper is a 27 y.o. female  With a history of pelvic adhesions chronic recurrent abdominal discomfort endometriosis, chromopertubation, STI in the past, states she has had suprapubic abdominal cramping for the last week or so off and on.  Sometimes is a cramping sensation most the time is gone.  No fever no chills.  No nausea no vomiting or diarrhea.  She states that the same time the started she began to have a white clumpy discharge.  She is with one partner for the last year does not believe she is been exposed to STI.  Denies any fever or chills.  No other alleviating or aggravating factors, is not having very much discomfort at this time.  She also states that she is been constipated for the last week. Is taken no medications for this.  has a history of chronic constipation she is taken nothing for it.  Past Medical History:  Diagnosis Date  . Medical history non-contributory     Patient Active Problem List   Diagnosis Date Noted  . Pelvic adhesions 10/17/2012    Class: Present on Admission  . Endometriosis 10/17/2012    Class: Present on Admission    Past Surgical History:  Procedure Laterality Date  . CHROMOPERTUBATION N/A 10/17/2012   Procedure: CHROMOPERTUBATION;  Surgeon: Darlyn Chamber, MD;  Location: [place] [facility];  Service: Gynecology;  Laterality: N/A;  attempted  . LAPAROSCOPIC LYSIS OF ADHESIONS N/A 10/17/2012   Procedure: LAPAROSCOPIC LYSIS OF ADHESIONS;  Surgeon: Darlyn Chamber, MD;  Location: [place] [facility];  Service: Gynecology;  Laterality: N/A;  . LAPAROSCOPY N/A 10/17/2012   Procedure: LAPAROSCOPY OPERATIVE;  Surgeon: Darlyn Chamber, MD;   Location: [place] [facility];  Service: Gynecology;  Laterality: N/A;  . NO PAST SURGERIES      Prior to Admission medications   Medication Sig Start Date End Date Taking? Authorizing Provider  amoxicillin (AMOXIL) 500 MG tablet Take 1 tablet (500 mg total) by mouth 3 (three) times daily. 09/13/14   Ruffian, III Luanna Cole, PA-C  fluconazole (DIFLUCAN) 150 MG tablet Take 1 tablet (150 mg total) by mouth once. In seven days for continued symptoms 09/13/14   Ruffian, III William C, PA-C  Norethin Ace-Eth Estrad-FE (MINASTRIN 24 FE) 1-20 MG-MCG(24) CHEW Chew 1 tablet by mouth daily.    [provider]    Allergies Patient has no known allergies.  No family history on file.  Social History Social History   Tobacco Use  . Smoking status: Never Smoker  Substance Use Topics  . Alcohol use: No  . Drug use: No    Review of Systems Constitutional: No fever/chills Eyes: No visual changes. ENT: No sore throat. No stiff neck no neck pain Cardiovascular: Denies chest pain. Respiratory: Denies shortness of breath. Gastrointestinal:   no vomiting.  No diarrhea.  + constipation. Genitourinary: Negative for dysuria. Musculoskeletal: Negative lower extremity swelling Skin: Negative for rash. Neurological: Negative for severe headaches, focal weakness or numbness.   ____________________________________________   PHYSICAL EXAM:  VITAL SIGNS: ED Triage Vitals [10/14/17 0439]  Enc Vitals Group     BP 113/67     Pulse Rate 85     Resp 18  Temp 98.2 F (36.8 C)     Temp Source Oral     SpO2 100 %     Weight 160 lb (72.6 kg)     Height 5\' 2"  (1.575 m)     Head Circumference      Peak Flow      Pain Score 5     Pain Loc      Pain Edu?      Excl. in [place]?     Constitutional: Alert and oriented. Well appearing and in no acute distress. Eyes: Conjunctivae are normal Head: Atraumatic HEENT: No congestion/rhinnorhea. Mucous membranes are moist.  Oropharynx non-erythematous Neck:    Nontender with no meningismus, no masses, no stridor Cardiovascular: Normal rate, regular rhythm. Grossly normal heart sounds.  Good peripheral circulation. Respiratory: Normal respiratory effort.  No retractions. Lungs CTAB. Abdominal: Soft and nontender. No distention. No guarding no rebound Back:  There is no focal tenderness or step off.  there is no midline tenderness there are no lesions noted. there is no CVA tenderness Musculoskeletal: No lower extremity tenderness, no upper extremity tenderness. No joint effusions, no DVT signs strong distal pulses no edema Neurologic:  Normal speech and language. No gross focal neurologic deficits are appreciated.  Skin:  Skin is warm, dry and intact. No rash noted. Psychiatric: Mood and affect are normal. Speech and behavior are normal.  ____________________________________________   LABS (all labs ordered are listed, but only abnormal results are displayed)  Labs Reviewed  COMPREHENSIVE METABOLIC PANEL - Abnormal; Notable for the following components:      Result Value   Calcium 8.8 (*)    All other components within normal limits  CBC - Abnormal; Notable for the following components:   Hemoglobin 10.1 (*)    HCT 32.1 (*)    MCV 69.3 (*)    MCH 21.8 (*)    MCHC 31.5 (*)    RDW 18.5 (*)    All other components within normal limits  URINALYSIS, COMPLETE (UACMP) WITH MICROSCOPIC - Abnormal; Notable for the following components:   Color, Urine YELLOW (*)    APPearance CLEAR (*)    Leukocytes, UA TRACE (*)    All other components within normal limits  CHLAMYDIA/NGC RT PCR ([facility] ONLY)  WET PREP, GENITAL  LIPASE, BLOOD  PREGNANCY, URINE    Pertinent labs  results that were available during my care of the patient were reviewed by me and considered in my medical decision making (see chart for details). ____________________________________________  EKG  I personally interpreted any EKGs ordered by me or  triage  ____________________________________________  RADIOLOGY  Pertinent labs & imaging results that were available during my care of the patient were reviewed by me and considered in my medical decision making (see chart for details). If possible, patient and/or family made aware of any abnormal findings.  No results found. ____________________________________________    PROCEDURES  Procedure(s) performed: None  Procedures  Critical Care performed: None  ____________________________________________   INITIAL IMPRESSION / ASSESSMENT AND PLAN / ED COURSE  Pertinent labs & imaging results that were available during my care of the patient were reviewed by me and considered in my medical decision making (see chart for details).  Patient with a week of crampy discomfort associated with a vaginal discharge and constipation.  Blood work is reassuring urinalysis reassuring she is not pregnant, will perform pelvic exam to rule out PID, patient does have chronic recurrent abdominal discomfort, this time no indication I do not think  is present for CT scan.  Low suspicion for ovarian cyst or torsion no history of that, but we will see if she has lateralizing signs on bimanual exam.  Do not think that she has appendicitis or diverticular obstruction.  Will get a KUB given her chronic constipation and we will reassess her  ----------------------------------------- 10:06 AM on 10/14/2017 -----------------------------------------  Pelvic exam: Female nurse chaperone present, no external lesions noted, physiologic vaginal discharge noted with no purulent discharge, no cervical motion tenderness, no adnexal tenderness or mass, there is no significant uterine tenderness or mass. No vaginal bleeding   ____________________________________________   FINAL CLINICAL IMPRESSION(S) / ED DIAGNOSES  Final diagnoses:  None      This chart was dictated using voice recognition software.  Despite  best efforts to proofread,  errors can occur which can change meaning.      Schuyler Amor, MD 10/14/17 8315    Schuyler Amor, MD 10/14/17 1006

## 2017-12-12 ENCOUNTER — Emergency Department: Payer: Self-pay

## 2017-12-12 ENCOUNTER — Emergency Department
Admission: EM | Admit: 2017-12-12 | Discharge: 2017-12-12 | Disposition: A | Discharge status: Home / Self Care | Payer: Self-pay | Attending: Emergency Medicine | Admitting: Emergency Medicine

## 2017-12-12 ENCOUNTER — Encounter: Payer: Self-pay | Admitting: Emergency Medicine

## 2017-12-12 ENCOUNTER — Other Ambulatory Visit: Payer: Self-pay

## 2017-12-12 DIAGNOSIS — R079 Chest pain, unspecified: Secondary | ICD-10-CM | POA: Insufficient documentation

## 2017-12-12 DIAGNOSIS — J069 Acute upper respiratory infection, unspecified: Secondary | ICD-10-CM | POA: Insufficient documentation

## 2017-12-12 LAB — BASIC METABOLIC PANEL
Anion gap: 5 (ref 5–15)
BUN: 9 mg/dL (ref 6–20)
CHLORIDE: 106 mmol/L (ref 98–111)
CO2: 26 mmol/L (ref 22–32)
CREATININE: 0.69 mg/dL (ref 0.44–1.00)
Calcium: 9.9 mg/dL (ref 8.9–10.3)
GFR calc Af Amer: >60 mL/min (ref >60)
GFR calc non Af Amer: >60 mL/min (ref >60)
GLUCOSE: 94 mg/dL (ref 70–99)
POTASSIUM: 3.5 mmol/L (ref 3.5–5.1)
SODIUM: 137 mmol/L (ref 135–145)

## 2017-12-12 LAB — CBC
HEMATOCRIT: 28.8 % — AB (ref 35.0–47.0)
Hemoglobin: 8.6 g/dL — ABNORMAL LOW (ref 12.0–16.0)
MCH: 18.9 pg — AB (ref 26.0–34.0)
MCHC: 30 g/dL — AB (ref 32.0–36.0)
MCV: 63.1 fL — AB (ref 80.0–100.0)
PLATELETS: 254 10*3/uL (ref 150–440)
RBC: 4.57 MIL/uL (ref 3.80–5.20)
RDW: 18.4 % — AB (ref 11.5–14.5)
WBC: 7.8 10*3/uL (ref 3.6–11.0)

## 2017-12-12 LAB — TROPONIN I: Troponin I: <0.03 ng/mL (ref <0.03)

## 2017-12-12 MED ORDER — FERROUS SULFATE 325 (65 FE) MG PO TABS: 325.0000 mg | ORAL_TABLET | Freq: Every day | ORAL | 3 refills | Status: AC

## 2017-12-12 MED ORDER — GUAIFENESIN-CODEINE 100-10 MG/5ML PO SOLN: 5.0000 mL | Freq: Four times a day (QID) | ORAL | 0 refills | Status: DC | PRN

## 2017-12-12 MED ORDER — FERROUS SULFATE 325 (65 FE) MG PO TABS: 325.0000 mg | ORAL_TABLET | Freq: Every day | ORAL | 3 refills | Status: DC

## 2017-12-12 NOTE — ED Provider Notes (Signed)
[facility] Emergency Department Provider Note  Time seen: 6:53 PM  I have reviewed the triage vital signs and the nursing notes.   HISTORY  Chief Complaint Chest Pain    HPI Teresa Cooper is a 27 y.o. female with no significant past medical history who presents to the emergency department for cough and chest pain.  According to the patient for the past 2 days she has had a cough and been experiencing pain in the upper center of her chest.  States it is somewhat worse with deep inspiration or cough.  Denies any fever or sputum production.  Denies any nausea or diaphoresis.  Denies any leg pain or swelling.   Past Medical History:  Diagnosis Date  . Medical history non-contributory     Patient Active Problem List   Diagnosis Date Noted  . Pelvic adhesions 10/17/2012    Class: Present on Admission  . Endometriosis 10/17/2012    Class: Present on Admission    Past Surgical History:  Procedure Laterality Date  . CHROMOPERTUBATION N/A 10/17/2012   Procedure: CHROMOPERTUBATION;  Surgeon: Darlyn Chamber, MD;  Location: [place] [facility];  Service: Gynecology;  Laterality: N/A;  attempted  . LAPAROSCOPIC LYSIS OF ADHESIONS N/A 10/17/2012   Procedure: LAPAROSCOPIC LYSIS OF ADHESIONS;  Surgeon: Darlyn Chamber, MD;  Location: [place] [facility];  Service: Gynecology;  Laterality: N/A;  . LAPAROSCOPY N/A 10/17/2012   Procedure: LAPAROSCOPY OPERATIVE;  Surgeon: Darlyn Chamber, MD;  Location: [place] [facility];  Service: Gynecology;  Laterality: N/A;  . NO PAST SURGERIES      Prior to Admission medications   Not on File    No Known Allergies  No family history on file.  Social History Social History   Tobacco Use  . Smoking status: Never Smoker  . Smokeless tobacco: Never Used  Substance Use Topics  . Alcohol use: No  . Drug use: No    Review of Systems Constitutional: Negative for fever. Cardiovascular: Mild to moderate central chest pain Respiratory: Negative for shortness of  breath.  Positive for cough. Gastrointestinal: Negative for abdominal pain, vomiting  Genitourinary: Negative for urinary compaints Musculoskeletal: Negative for leg pain or swelling Skin: Negative for skin complaints  Neurological: Negative for headache All other ROS negative  ____________________________________________   PHYSICAL EXAM:  VITAL SIGNS: ED Triage Vitals  Enc Vitals Group     BP 12/12/17 1707 130/76     Pulse Rate 12/12/17 1707 81     Resp 12/12/17 1707 16     Temp 12/12/17 1707 98.5 F (36.9 C)     Temp Source 12/12/17 1707 Oral     SpO2 12/12/17 1707 100 %     Weight 12/12/17 1708 160 lb (72.6 kg)     Height 12/12/17 1708 5\' 2"  (1.575 m)     Head Circumference --      Peak Flow --      Pain Score 12/12/17 1707 3     Pain Loc --      Pain Edu? --      Excl. in [place]? --    Constitutional: Alert and oriented. Well appearing and in no distress. Eyes: Normal exam ENT   Head: Normocephalic and atraumatic.   Mouth/Throat: Mucous membranes are moist. Cardiovascular: Normal rate, regular rhythm. No murmur Respiratory: Normal respiratory effort without tachypnea nor retractions. Breath sounds are clear  Gastrointestinal: Soft and nontender. No distention.   Musculoskeletal: Lower extremity's are nontender with no edema.  Patient does have  moderate tenderness to palpation of the mid to upper portion of the sternum. Neurologic:  Normal speech and language. No gross focal neurologic deficits are appreciated. Skin:  Skin is warm, dry and intact.  Psychiatric: Mood and affect are normal. Speech and behavior are normal.   ____________________________________________    EKG  EKG reviewed and interpreted by myself shows normal sinus rhythm at 84 bpm with a narrow QRS, normal axis, normal intervals, no concerning ST changes.  ____________________________________________    RADIOLOGY  Chest x-ray is  clear  ____________________________________________   INITIAL IMPRESSION / ASSESSMENT AND PLAN / ED COURSE  Pertinent labs & imaging results that were available during my care of the patient were reviewed by me and considered in my medical decision making (see chart for details).  Patient presents emergency department for mid to upper sternal pain with cough over the past 2 days.  Differential would include ACS, pneumonia, pneumothorax, bronchitis, URI.  Overall the patient appears very well the chest discomfort is reproducible with palpation.  Her labs are largely at baseline she is anemic but is anemic at baseline.  Has not been taking iron supplements because she does not currently have insurance to see her primary care doctor.  Discussed with the patient we will write her for iron supplementation.  Patient's cardiac enzymes are negative, chest x-ray is clear and EKG is reassuring.  Given the patient's cough I highly suspect chest wall pain versus URI to be the cause of her discomfort.  We will prescribe a short course of cough medication.  Patient agreeable to plan of care.  Provided my normal chest pain return precautions.  ____________________________________________   FINAL CLINICAL IMPRESSION(S) / ED DIAGNOSES  Upper respiratory infection Chest pain    Harvest Dark, MD 12/12/17 1857

## 2017-12-12 NOTE — ED Triage Notes (Addendum)
Pt to ED via POV with c/o CP xfew days with deep breaths per pt , + cough. NAD noted, VSS

## 2018-02-02 ENCOUNTER — Other Ambulatory Visit: Payer: Self-pay

## 2018-02-02 ENCOUNTER — Emergency Department
Admission: EM | Admit: 2018-02-02 | Discharge: 2018-02-02 | Disposition: A | Discharge status: Home / Self Care | Payer: Self-pay | Attending: Emergency Medicine | Admitting: Emergency Medicine

## 2018-02-02 DIAGNOSIS — M62838 Other muscle spasm: Secondary | ICD-10-CM | POA: Insufficient documentation

## 2018-02-02 DIAGNOSIS — Z79899 Other long term (current) drug therapy: Secondary | ICD-10-CM | POA: Insufficient documentation

## 2018-02-02 MED ORDER — CYCLOBENZAPRINE HCL 5 MG PO TABS: 5.0000 mg | ORAL_TABLET | Freq: Three times a day (TID) | ORAL | 0 refills | Status: DC | PRN

## 2018-02-02 MED ORDER — NAPROXEN 500 MG PO TABS
500.0000 mg | ORAL_TABLET | Freq: Once | ORAL | Status: AC
  Administered 2018-02-02: 500 mg via ORAL
  Filled 2018-02-02: qty 1

## 2018-02-02 MED ORDER — CYCLOBENZAPRINE HCL 5 MG PO TABS: 5.0000 mg | ORAL_TABLET | Freq: Three times a day (TID) | ORAL | 0 refills | Status: AC | PRN

## 2018-02-02 MED ORDER — CYCLOBENZAPRINE HCL 10 MG PO TABS
10.0000 mg | ORAL_TABLET | Freq: Once | ORAL | Status: AC
  Administered 2018-02-02: 10 mg via ORAL
  Filled 2018-02-02: qty 1

## 2018-02-02 NOTE — ED Triage Notes (Signed)
Pt comes via POV from home with c/o right neck pain. Pt states she was getting dressed and heard some pops and cracks. Pt states she isn't able to turn her neck to the right due to pain. Pt denies any recent injury. Pt is A&OX4.

## 2018-02-02 NOTE — Discharge Instructions (Signed)
Your exam is normal, without any signs of serious neck or midback spinal injury. Your exam is consistent with muscle strain and spasms. Take the prescription muscle relaxant as directed. Take OTC ibuprofen for muscle pain and inflammation relief. Apply ice and/or moist heat to reduce symptoms. Follow-up with [facility] or return as needed.

## 2018-02-02 NOTE — ED Provider Notes (Signed)
[facility] Emergency Department Provider Note ____________________________________________  Time seen: 1557  I have reviewed the triage vital signs and the nursing notes.  HISTORY  Chief Complaint  Neck Pain  HPI Teresa Cooper is a 27 y.o. female who presents to the ED for evaluation of right-sided posterior neck and mid back muscle pain.  Patient describes onset was this morning as she was getting dressed.  She describes having her arms up overhead to don her shirt, when she looked up she apparently experienced a series of "pops" and "cracks" to the right side of the posterior neck.  She denies any distal paresthesias, grip changes, or chest pain.  She describes increasing muscle tension and pain with attempts to look to the right and leaning to the right.  She did take a single dose of Tylenol without much benefit following onset.  She presents now for right-sided scapulothoracic neck pain.  Past Medical History:  Diagnosis Date  . Medical history non-contributory     Patient Active Problem List   Diagnosis Date Noted  . Pelvic adhesions 10/17/2012    Class: Present on Admission  . Endometriosis 10/17/2012    Class: Present on Admission    Past Surgical History:  Procedure Laterality Date  . CHROMOPERTUBATION N/A 10/17/2012   Procedure: CHROMOPERTUBATION;  Surgeon: Darlyn Chamber, MD;  Location: [place] [facility];  Service: Gynecology;  Laterality: N/A;  attempted  . LAPAROSCOPIC LYSIS OF ADHESIONS N/A 10/17/2012   Procedure: LAPAROSCOPIC LYSIS OF ADHESIONS;  Surgeon: Darlyn Chamber, MD;  Location: [place] [facility];  Service: Gynecology;  Laterality: N/A;  . LAPAROSCOPY N/A 10/17/2012   Procedure: LAPAROSCOPY OPERATIVE;  Surgeon: Darlyn Chamber, MD;  Location: [place] [facility];  Service: Gynecology;  Laterality: N/A;  . NO PAST SURGERIES      Prior to Admission medications   Medication Sig Start Date End Date Taking? Authorizing Provider  cyclobenzaprine (FLEXERIL) 5 MG tablet Take 1  tablet (5 mg total) by mouth 3 (three) times daily as needed for up to 3 days for muscle spasms. 02/02/18 02/05/18  Kyndel Egger, Dannielle Karvonen, PA-C  ferrous sulfate 325 (65 FE) MG tablet Take 1 tablet (325 mg total) by mouth daily. 12/12/17 12/12/18  Harvest Dark, MD  guaiFENesin-codeine 100-10 MG/5ML syrup Take 5 mLs by mouth every 6 (six) hours as needed for cough. 12/12/17   Harvest Dark, MD    Allergies Patient has no known allergies.  History reviewed. No pertinent family history.  Social History Social History   Tobacco Use  . Smoking status: Never Smoker  . Smokeless tobacco: Never Used  Substance Use Topics  . Alcohol use: No  . Drug use: No    Review of Systems  Constitutional: Negative for fever. Eyes: Negative for visual changes. ENT: Negative for sore throat. Cardiovascular: Negative for chest pain. Respiratory: Negative for shortness of breath. Gastrointestinal: Negative for abdominal pain, vomiting and diarrhea. Genitourinary: Negative for dysuria. Musculoskeletal: Negative for back pain.  Reports right scapulothoracic pain. Skin: Negative for rash. Neurological: Negative for headaches, focal weakness or numbness. ____________________________________________  PHYSICAL EXAM:  VITAL SIGNS: ED Triage Vitals  Enc Vitals Group     BP 02/02/18 1523 123/77     Pulse Rate 02/02/18 1523 71     Resp 02/02/18 1523 18     Temp 02/02/18 1523 98.5 F (36.9 C)     Temp Source 02/02/18 1523 Oral     SpO2 02/02/18 1523 98 %     Weight 02/02/18  1524 160 lb (72.6 kg)     Height 02/02/18 1524 5\' 2"  (1.575 m)     Head Circumference --      Peak Flow --      Pain Score 02/02/18 1523 10     Pain Loc --      Pain Edu? --      Excl. in [place]? --     Constitutional: Alert and oriented. Well appearing and in no distress. GCS= 15 Head: Normocephalic and atraumatic. Eyes: Conjunctivae are normal. PERRL. Normal extraocular movements Ears: Canals clear. TMs intact  bilaterally. Neck: Supple. No distracting midline tenderness. No palpable spasm, deformity, or step-off. Decreased right rotation and right lateral bending due to subjective complaints of pain. Hematological/Lymphatic/Immunological: No cervical lymphadenopathy. Cardiovascular: Normal rate, regular rhythm. Normal distal pulses. Respiratory: Normal respiratory effort. No wheezes/rales/rhonchi. Musculoskeletal: Nontender with normal range of motion in all extremities.  Neurologic: CN II-XII grossly intact. Normal intrinsic and opposition testing. Normal UE DTRs bilaterally. Normal gait without ataxia. Normal speech and language. No gross focal neurologic deficits are appreciated. Skin:  Skin is warm, dry and intact. No rash noted. Psychiatric: Mood and affect are normal. Patient exhibits appropriate insight and judgment. ____________________________________________   RADIOLOGY  Not indicated ____________________________________________  PROCEDURES  Procedures Flexeril 10 mg PO Naproxen 500 mg PO ____________________________________________  INITIAL IMPRESSION / ASSESSMENT AND PLAN / ED COURSE  She with ED evaluation of sudden right scapulothoracic muscle strain.  Patient's exam is consistent with a trapezius muscle strain.  Patient will be discharged with a prescription for Flexeril to take with over-the-counter anti-inflammatories.  She is encouraged to follow-up with her primary provider, [facility] or return to the ED as needed.  Patient is also advised to apply ice and/or moist heat to the area for pain relief. ____________________________________________  FINAL CLINICAL IMPRESSION(S) / ED DIAGNOSES  Final diagnoses:  Trapezius muscle spasm      Carmie End, Dannielle Karvonen, PA-C 02/02/18 1634    Earleen Newport, MD 02/02/18 [phone]

## 2018-02-02 NOTE — ED Notes (Signed)
Patient verbalized understanding of discharge instructions, no questions. Patient ambulated out of ED with steady gait in no distress.  

## 2019-08-24 IMAGING — CR DG ABDOMEN 1V
2 series · 2 of 2 positions shown · non-contrast
Comparison: No priors.

CLINICAL DATA: 26-year-old female with generalized abdominal pain
and constipation for 1 week.

EXAM:
ABDOMEN - 1 VIEW

[abdomen kub (1 of 2)]
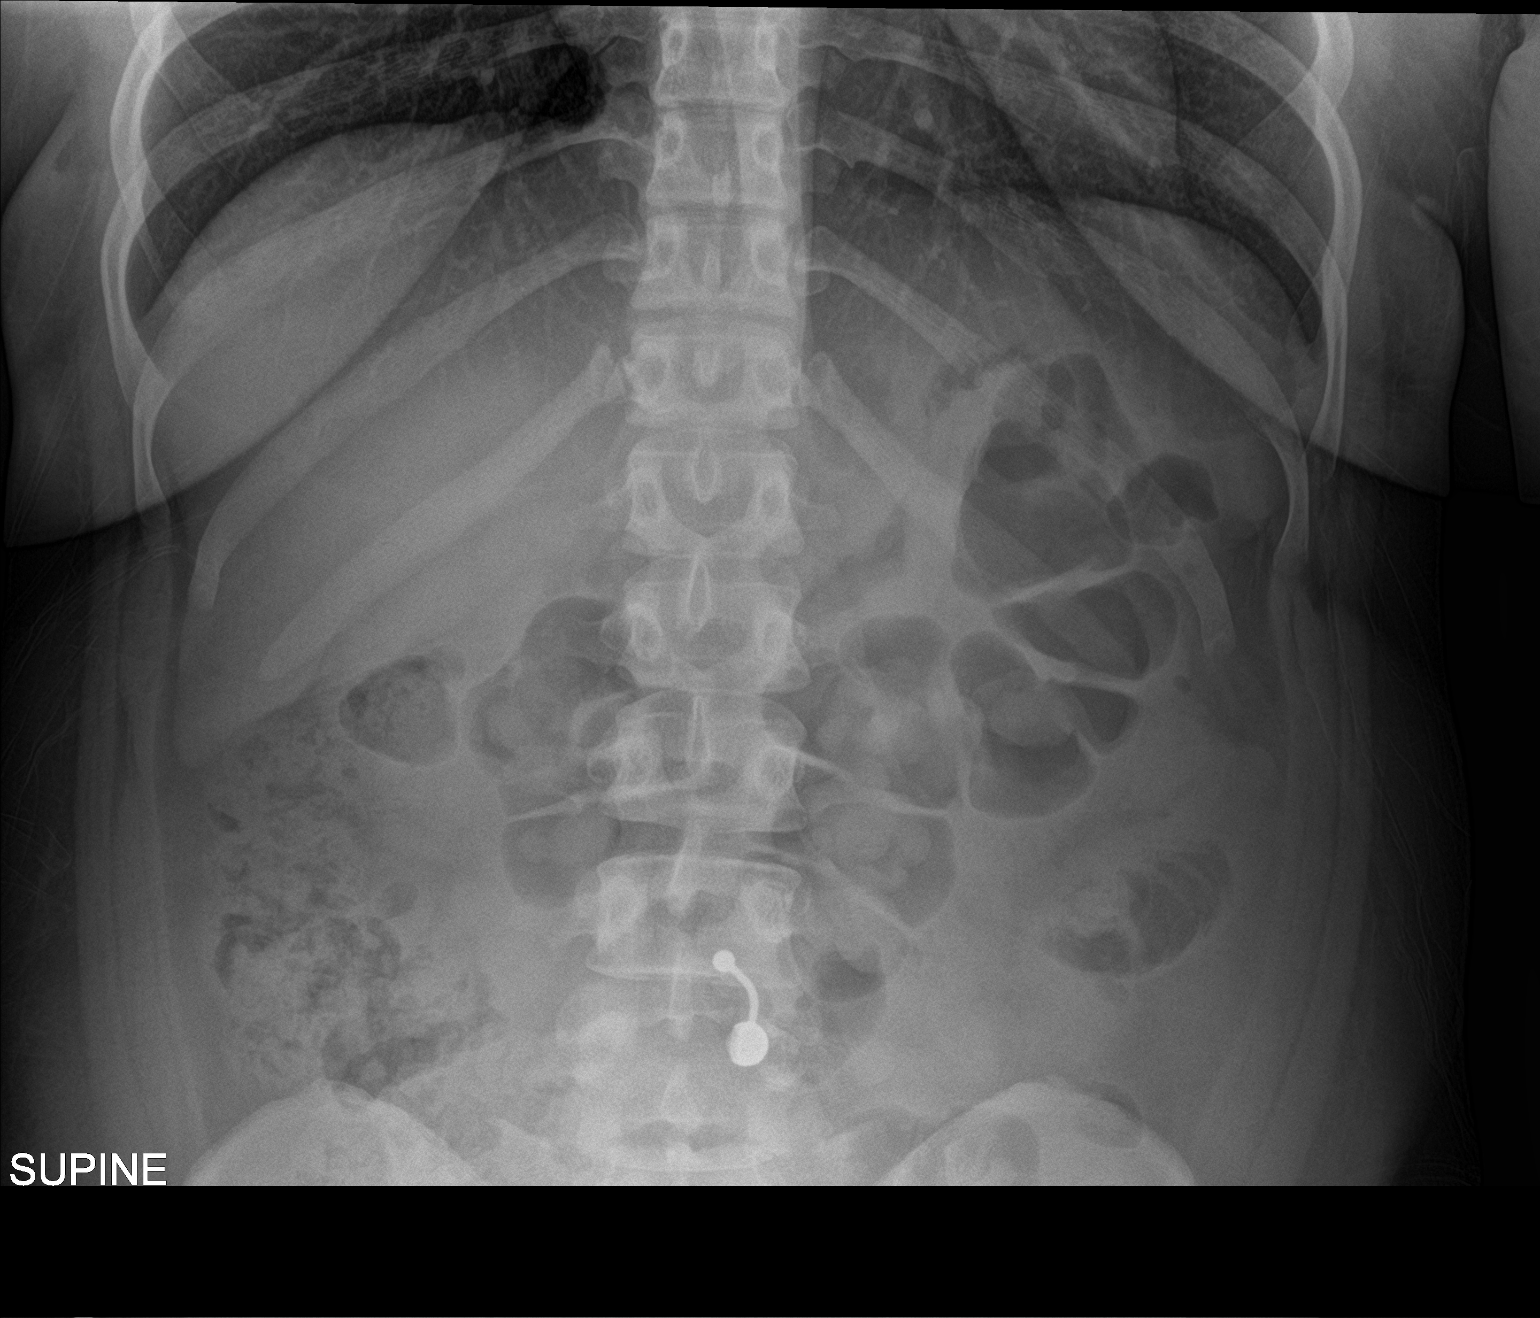

[abdomen kub (2 of 2)]
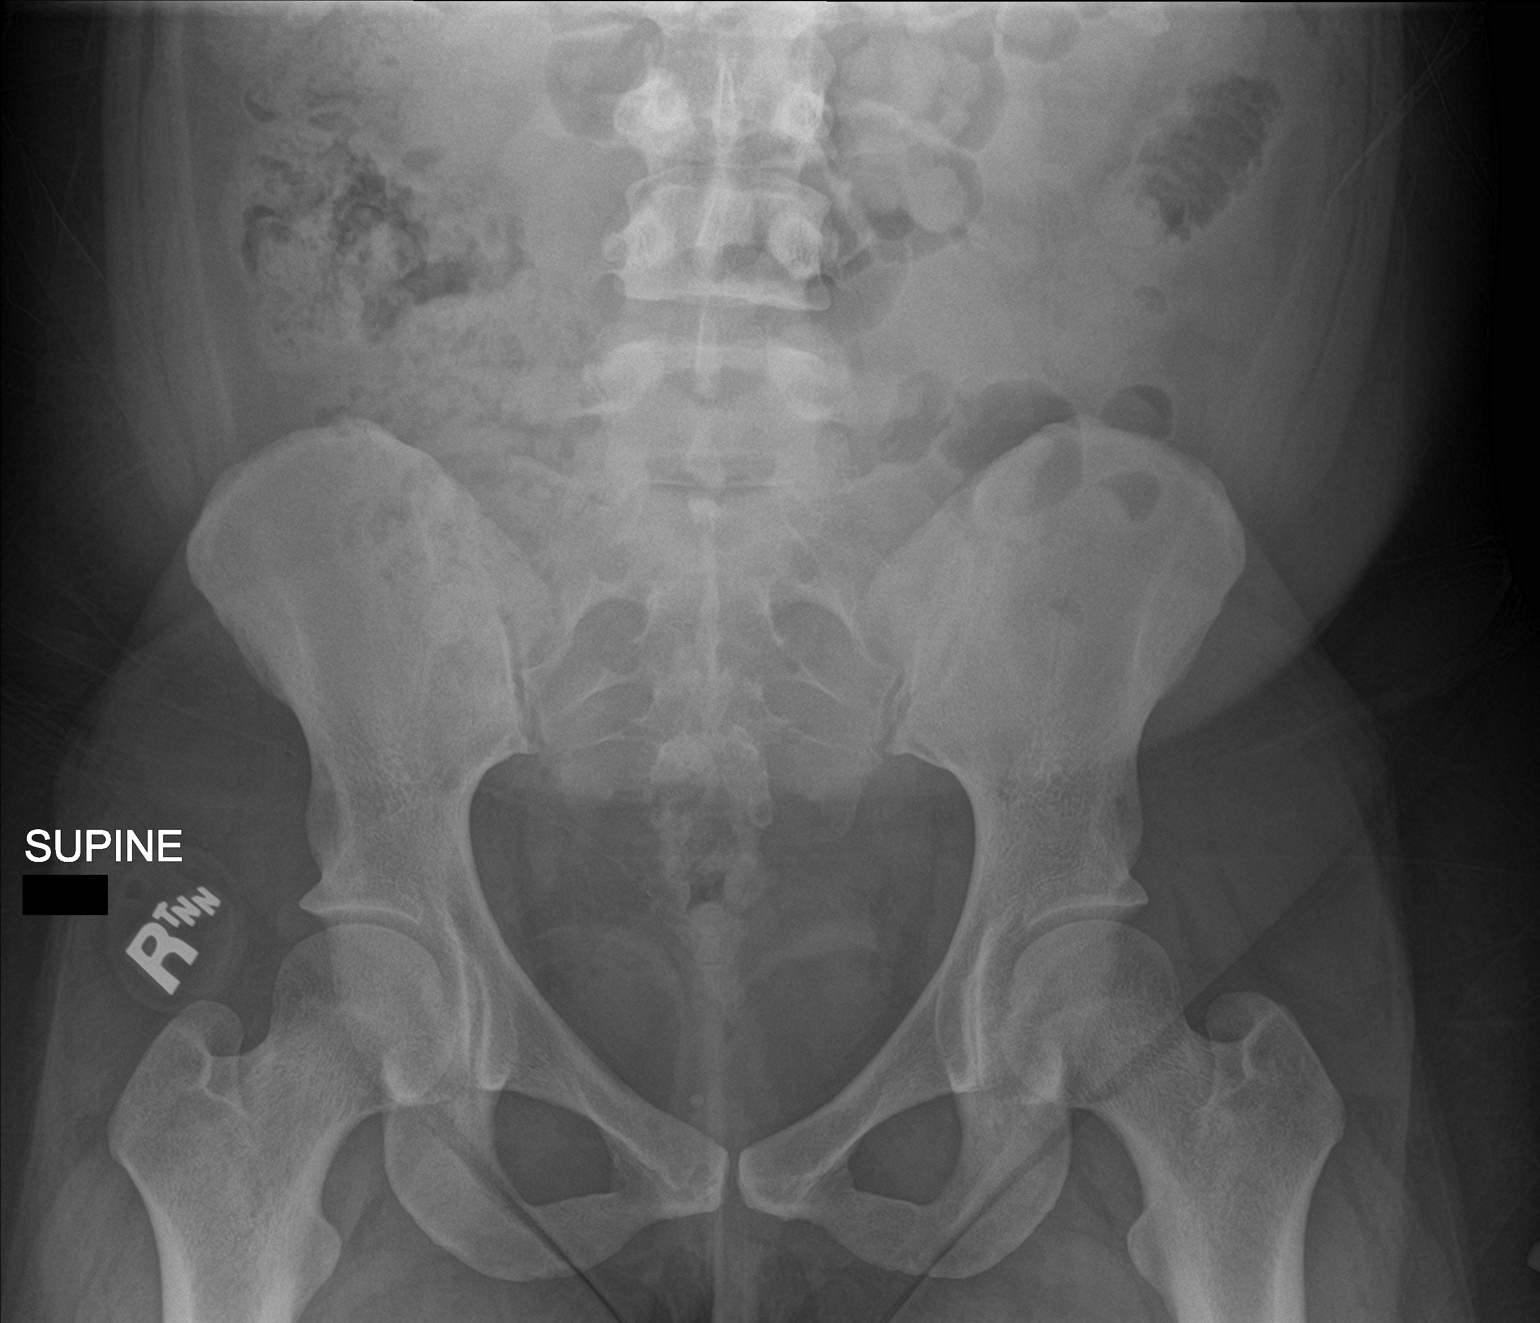

[2 of 2 positions shown; findings below may reference images not displayed]

FINDINGS: Metallic density projecting over the mid abdomen, presumably a belly
button ring. Gas and stool are seen scattered throughout the colon
extending to the level of the distal rectum. No pathologic
distension of small bowel is noted. No gross evidence of
pneumoperitoneum.
IMPRESSION: 1. Nonobstructive bowel gas pattern.
2. No pneumoperitoneum.

## 2019-11-07 ENCOUNTER — Other Ambulatory Visit: Payer: Self-pay

## 2019-11-07 ENCOUNTER — Emergency Department
Admission: EM | Admit: 2019-11-07 | Discharge: 2019-11-07 | Disposition: A | Discharge status: Home / Self Care | Payer: Self-pay | Attending: Emergency Medicine | Admitting: Emergency Medicine

## 2019-11-07 ENCOUNTER — Encounter: Payer: Self-pay | Admitting: Emergency Medicine

## 2019-11-07 DIAGNOSIS — L739 Follicular disorder, unspecified: Secondary | ICD-10-CM | POA: Insufficient documentation

## 2019-11-07 LAB — CBC WITH DIFFERENTIAL/PLATELET
Abs Immature Granulocytes: 0.03 10*3/uL (ref 0.00–0.07)
Basophils Absolute: 0.1 10*3/uL (ref 0.0–0.1)
Basophils Relative: 1 %
Eosinophils Absolute: 0.1 10*3/uL (ref 0.0–0.5)
Eosinophils Relative: 1 %
HCT: 33.8 % — ABNORMAL LOW (ref 36.0–46.0)
Hemoglobin: 10.2 g/dL — ABNORMAL LOW (ref 12.0–15.0)
Immature Granulocytes: 0 %
Lymphocytes Relative: 30 %
Lymphs Abs: 2.5 10*3/uL (ref 0.7–4.0)
MCH: 22.2 pg — ABNORMAL LOW (ref 26.0–34.0)
MCHC: 30.2 g/dL (ref 30.0–36.0)
MCV: 73.6 fL — ABNORMAL LOW (ref 80.0–100.0)
Monocytes Absolute: 0.5 10*3/uL (ref 0.1–1.0)
Monocytes Relative: 6 %
Neutro Abs: 5.1 10*3/uL (ref 1.7–7.7)
Neutrophils Relative %: 62 %
Platelets: 280 10*3/uL (ref 150–400)
RBC: 4.59 MIL/uL (ref 3.87–5.11)
RDW: 16.6 % — ABNORMAL HIGH (ref 11.5–15.5)
WBC: 8.2 10*3/uL (ref 4.0–10.5)
nRBC: 0 % (ref 0.0–0.2)

## 2019-11-07 LAB — CHLAMYDIA/NGC RT PCR (ARMC ONLY)
Chlamydia Tr: NOT DETECTED
N gonorrhoeae: NOT DETECTED

## 2019-11-07 LAB — URINALYSIS, COMPLETE (UACMP) WITH MICROSCOPIC
Bacteria, UA: NONE SEEN
Bilirubin Urine: NEGATIVE
Glucose, UA: NEGATIVE mg/dL
Hgb urine dipstick: NEGATIVE
Ketones, ur: NEGATIVE mg/dL
Leukocytes,Ua: NEGATIVE
Nitrite: NEGATIVE
Protein, ur: NEGATIVE mg/dL
Specific Gravity, Urine: 1.014 (ref 1.005–1.030)
pH: 6 (ref 5.0–8.0)

## 2019-11-07 LAB — COMPREHENSIVE METABOLIC PANEL
ALT: 11 U/L (ref 0–44)
AST: 14 U/L — ABNORMAL LOW (ref 15–41)
Albumin: 4 g/dL (ref 3.5–5.0)
Alkaline Phosphatase: 38 U/L (ref 38–126)
Anion gap: 9 (ref 5–15)
BUN: 11 mg/dL (ref 6–20)
CO2: 23 mmol/L (ref 22–32)
Calcium: 8.7 mg/dL — ABNORMAL LOW (ref 8.9–10.3)
Chloride: 105 mmol/L (ref 98–111)
Creatinine, Ser: 0.74 mg/dL (ref 0.44–1.00)
GFR calc Af Amer: >60 mL/min (ref >60)
GFR calc non Af Amer: >60 mL/min (ref >60)
Glucose, Bld: 82 mg/dL (ref 70–99)
Potassium: 3.6 mmol/L (ref 3.5–5.1)
Sodium: 137 mmol/L (ref 135–145)
Total Bilirubin: 0.7 mg/dL (ref 0.3–1.2)
Total Protein: 7.5 g/dL (ref 6.5–8.1)

## 2019-11-07 LAB — LIPASE, BLOOD: Lipase: 24 U/L (ref 11–51)

## 2019-11-07 LAB — POCT PREGNANCY, URINE: Preg Test, Ur: NEGATIVE

## 2019-11-07 MED ORDER — MUPIROCIN 2 % EX OINT: TOPICAL_OINTMENT | CUTANEOUS | 0 refills | Status: AC

## 2019-11-07 NOTE — ED Triage Notes (Signed)
Patient ambulatory to triage with steady gait, without difficulty or distress noted; pt reports lower abd pain x 2 days with no accomp symptoms

## 2019-11-07 NOTE — ED Notes (Addendum)
See triage note. Presents with lower abd pain which started couple of days ago  No n/v/d  Low grade temp on arrival

## 2019-11-07 NOTE — ED Provider Notes (Signed)
[facility] Emergency Department Provider Note   ____________________________________________    I have reviewed the triage vital signs and the nursing notes.   HISTORY  Chief Complaint Rash    HPI Teresa Cooper is a 29 y.o. female with past medical history as noted below who presents with complaints of rash to her perineum that she noticed yesterday.  She reports it is not particularly painful.  No itching.  Denies fevers or chills.  Did have some abdominal cramping, now resolved.  Normal stools, no vaginal discharge.  Has not take anything for it.  No sick contacts.  Past Medical History:  Diagnosis Date  . Medical history non-contributory     Patient Active Problem List   Diagnosis Date Noted  . Pelvic adhesions 10/17/2012    Class: Present on Admission  . Endometriosis 10/17/2012    Class: Present on Admission    Past Surgical History:  Procedure Laterality Date  . CHROMOPERTUBATION N/A 10/17/2012   Procedure: CHROMOPERTUBATION;  Surgeon: Darlyn Chamber, MD;  Location: [place] [facility];  Service: Gynecology;  Laterality: N/A;  attempted  . LAPAROSCOPIC LYSIS OF ADHESIONS N/A 10/17/2012   Procedure: LAPAROSCOPIC LYSIS OF ADHESIONS;  Surgeon: Darlyn Chamber, MD;  Location: [place] [facility];  Service: Gynecology;  Laterality: N/A;  . LAPAROSCOPY N/A 10/17/2012   Procedure: LAPAROSCOPY OPERATIVE;  Surgeon: Darlyn Chamber, MD;  Location: [place] [facility];  Service: Gynecology;  Laterality: N/A;  . NO PAST SURGERIES      Prior to Admission medications   Medication Sig Start Date End Date Taking? Authorizing Provider  ferrous sulfate 325 (65 FE) MG tablet Take 1 tablet (325 mg total) by mouth daily. 12/12/17 12/12/18  Harvest Dark, MD  mupirocin ointment Drue Stager) 2 % Apply to affected area 3 times daily 11/07/19 11/06/20  Lavonia Drafts, MD     Allergies Patient has no known allergies.  No family history on file.  Social History Social History   Tobacco Use  .  Smoking status: Never Smoker  . Smokeless tobacco: Never Used  Substance Use Topics  . Alcohol use: No  . Drug use: No    Review of Systems  Constitutional: No fever/chills  ENT: No sore throat.   Gastrointestinal: As above Genitourinary: Negative for dysuria.  As above  Skin: As above     ____________________________________________   PHYSICAL EXAM:  VITAL SIGNS: ED Triage Vitals [11/07/19 0608]  Enc Vitals Group     BP 123/87     Pulse Rate 93     Resp 18     Temp 99.5 F (37.5 C)     Temp Source Oral     SpO2 98 %     Weight 76.2 kg (168 lb)     Height 1.575 m (5\' 2" )     Head Circumference      Peak Flow      Pain Score 7     Pain Loc      Pain Edu?      Excl. in [place]?      Constitutional: Alert and oriented.   Nose: No congestion/rhinnorhea. Mouth/Throat: Mucous membranes are moist.   Cardiovascular: Normal rate, regular rhythm.  Respiratory: Normal respiratory effort.  No retractions. Genitourinary: Small vesicles noted, clear overlying hair follicles where patient is clearly shaved most consistent with folliculitis, no abscess or cellulitis  Neurologic:  Normal speech and language. No gross focal neurologic deficits are appreciated.   Skin:  Skin is warm, dry and  intact.   ____________________________________________   LABS (all labs ordered are listed, but only abnormal results are displayed)  Labs Reviewed  CBC WITH DIFFERENTIAL/PLATELET - Abnormal; Notable for the following components:      Result Value   Hemoglobin 10.2 (*)    HCT 33.8 (*)    MCV 73.6 (*)    MCH 22.2 (*)    RDW 16.6 (*)    All other components within normal limits  COMPREHENSIVE METABOLIC PANEL - Abnormal; Notable for the following components:   Calcium 8.7 (*)    AST 14 (*)    All other components within normal limits  URINALYSIS, COMPLETE (UACMP) WITH MICROSCOPIC - Abnormal; Notable for the following components:   Color, Urine YELLOW (*)    APPearance HAZY (*)     All other components within normal limits  LIPASE, BLOOD  POCT PREGNANCY, URINE   ____________________________________________  EKG   ____________________________________________  RADIOLOGY   ____________________________________________   PROCEDURES  Procedure(s) performed: No  Procedures   Critical Care performed: No ____________________________________________   INITIAL IMPRESSION / ASSESSMENT AND PLAN / ED COURSE  Pertinent labs & imaging results that were available during my care of the patient were reviewed by me and considered in my medical decision making (see chart for details).  Exam is most consistent with folliculitis, will Rx topical Bactroban 3 times daily, outpatient follow-up at health department if further STD testing desired   ____________________________________________   FINAL CLINICAL IMPRESSION(S) / ED DIAGNOSES  Final diagnoses:  Folliculitis      NEW MEDICATIONS STARTED DURING THIS VISIT:  New Prescriptions   MUPIROCIN OINTMENT (BACTROBAN) 2 %    Apply to affected area 3 times daily     Note:  This document was prepared using Dragon voice recognition software and may include unintentional dictation errors.   Lavonia Drafts, MD 11/07/19 [phone]

## 2019-11-07 NOTE — ED Notes (Signed)
Repeat VS obtained by this RN. Pt visualized in NAD at this time. Pt A&O x4. This RN apologized and explained delay to patient.

## 2021-02-20 ENCOUNTER — Emergency Department
Admission: EM | Admit: 2021-02-20 | Discharge: 2021-02-20 | Disposition: A | Discharge status: Home / Self Care | Payer: Self-pay | Attending: Student in an Organized Health Care Education/Training Program | Admitting: Student in an Organized Health Care Education/Training Program

## 2021-02-20 ENCOUNTER — Emergency Department: Payer: Self-pay

## 2021-02-20 ENCOUNTER — Other Ambulatory Visit: Payer: Self-pay

## 2021-02-20 ENCOUNTER — Encounter: Payer: Self-pay | Admitting: Emergency Medicine

## 2021-02-20 DIAGNOSIS — R1033 Periumbilical pain: Secondary | ICD-10-CM | POA: Insufficient documentation

## 2021-02-20 DIAGNOSIS — R1030 Lower abdominal pain, unspecified: Secondary | ICD-10-CM

## 2021-02-20 DIAGNOSIS — R1031 Right lower quadrant pain: Secondary | ICD-10-CM | POA: Insufficient documentation

## 2021-02-20 LAB — WET PREP, GENITAL
Clue Cells Wet Prep HPF POC: NONE SEEN
Sperm: NONE SEEN
Trich, Wet Prep: NONE SEEN
WBC, Wet Prep HPF POC: <10 (ref <10)
Yeast Wet Prep HPF POC: NONE SEEN

## 2021-02-20 LAB — COMPREHENSIVE METABOLIC PANEL
ALT: 13 U/L (ref 0–44)
AST: 19 U/L (ref 15–41)
Albumin: 3.5 g/dL (ref 3.5–5.0)
Alkaline Phosphatase: 45 U/L (ref 38–126)
Anion gap: 6 (ref 5–15)
BUN: 9 mg/dL (ref 6–20)
CO2: 26 mmol/L (ref 22–32)
Calcium: 9.1 mg/dL (ref 8.9–10.3)
Chloride: 104 mmol/L (ref 98–111)
Creatinine, Ser: 0.7 mg/dL (ref 0.44–1.00)
GFR, Estimated: >60 mL/min (ref >60)
Glucose, Bld: 93 mg/dL (ref 70–99)
Potassium: 3.9 mmol/L (ref 3.5–5.1)
Sodium: 136 mmol/L (ref 135–145)
Total Bilirubin: 0.7 mg/dL (ref 0.3–1.2)
Total Protein: 8.1 g/dL (ref 6.5–8.1)

## 2021-02-20 LAB — URINALYSIS, ROUTINE W REFLEX MICROSCOPIC
Bacteria, UA: NONE SEEN
Bilirubin Urine: NEGATIVE
Glucose, UA: NEGATIVE mg/dL
Hgb urine dipstick: NEGATIVE
Ketones, ur: NEGATIVE mg/dL
Leukocytes,Ua: NEGATIVE
Nitrite: NEGATIVE
Protein, ur: NEGATIVE mg/dL
Specific Gravity, Urine: 1.019 (ref 1.005–1.030)
pH: 8 (ref 5.0–8.0)

## 2021-02-20 LAB — POC URINE PREG, ED: Preg Test, Ur: NEGATIVE

## 2021-02-20 LAB — CBC
HCT: 38.6 % (ref 36.0–46.0)
Hemoglobin: 12.1 g/dL (ref 12.0–15.0)
MCH: 24.9 pg — ABNORMAL LOW (ref 26.0–34.0)
MCHC: 31.3 g/dL (ref 30.0–36.0)
MCV: 79.4 fL — ABNORMAL LOW (ref 80.0–100.0)
Platelets: 294 10*3/uL (ref 150–400)
RBC: 4.86 MIL/uL (ref 3.87–5.11)
RDW: 14.6 % (ref 11.5–15.5)
WBC: 8.3 10*3/uL (ref 4.0–10.5)
nRBC: 0 % (ref 0.0–0.2)

## 2021-02-20 LAB — CHLAMYDIA/NGC RT PCR (ARMC ONLY)
Chlamydia Tr: NOT DETECTED
N gonorrhoeae: NOT DETECTED

## 2021-02-20 LAB — LIPASE, BLOOD: Lipase: 27 U/L (ref 11–51)

## 2021-02-20 MED ORDER — IOHEXOL 300 MG/ML  SOLN
100.0000 mL | Freq: Once | INTRAMUSCULAR | Status: AC | PRN
  Administered 2021-02-20: 100 mL via INTRAVENOUS
  Filled 2021-02-20: qty 100

## 2021-02-20 NOTE — Discharge Instructions (Signed)

## 2021-02-20 NOTE — ED Triage Notes (Signed)
Pt reports abd pain across her lower abd for the past week. Pt denies NVD, urinary sx's or other sx's. Pt states the pain is intermittent.

## 2021-02-20 NOTE — ED Notes (Signed)
Dc ppw provided. Pt denies any questions. Follow-up information given. Pt provides verbal consent for dc. Pt assisted off unit on foot

## 2021-02-20 NOTE — ED Provider Notes (Signed)
[facility] Emergency Department Provider Note    Event Date/Time   First MD Initiated Contact with Patient 02/20/21 1404     (approximate)  I have reviewed the triage vital signs and the nursing notes.   HISTORY  Chief Complaint Abdominal Pain    HPI Teresa Cooper is a 30 y.o. female below listed past medical history presents to the ER for evaluation of progressively worsening lower abdominal pain initially periumbilical in nature now having more discomfort in the right lower quadrant.  Describes it as crampy it comes and goes.  Denies any dysuria or vaginal discharge.  Has had some chills has not had as much of an appetite.  No previous abdominal surgeries.  No history of ovarian issues.  Past Medical History:  Diagnosis Date   Medical history non-contributory    No family history on file. Past Surgical History:  Procedure Laterality Date   CHROMOPERTUBATION N/A 10/17/2012   Procedure: CHROMOPERTUBATION;  Surgeon: Darlyn Chamber, MD;  Location: [place] [facility];  Service: Gynecology;  Laterality: N/A;  attempted   LAPAROSCOPIC LYSIS OF ADHESIONS N/A 10/17/2012   Procedure: LAPAROSCOPIC LYSIS OF ADHESIONS;  Surgeon: Darlyn Chamber, MD;  Location: [place] [facility];  Service: Gynecology;  Laterality: N/A;   LAPAROSCOPY N/A 10/17/2012   Procedure: LAPAROSCOPY OPERATIVE;  Surgeon: Darlyn Chamber, MD;  Location: [place] [facility];  Service: Gynecology;  Laterality: N/A;   NO PAST SURGERIES     Patient Active Problem List   Diagnosis Date Noted   Pelvic adhesions 10/17/2012    Class: Present on Admission   Endometriosis 10/17/2012    Class: Present on Admission      Prior to Admission medications   Medication Sig Start Date End Date Taking? Authorizing Provider  ferrous sulfate 325 (65 FE) MG tablet Take 1 tablet (325 mg total) by mouth daily. 12/12/17 12/12/18  Harvest Dark, MD    Allergies Patient has no known allergies.    Social History Social History   Tobacco Use    Smoking status: Never   Smokeless tobacco: Never  Substance Use Topics   Alcohol use: No   Drug use: No    Review of Systems Patient denies headaches, rhinorrhea, blurry vision, numbness, shortness of breath, chest pain, edema, cough, abdominal pain, nausea, vomiting, diarrhea, dysuria, fevers, rashes or hallucinations unless otherwise stated above in HPI. ____________________________________________   PHYSICAL EXAM:  VITAL SIGNS: Vitals:   02/20/21 0948  BP: 109/79  Pulse: 87  Resp: 20  Temp: 100 F (37.8 C)  SpO2: 96%    Constitutional: Alert and oriented.  Eyes: Conjunctivae are normal.  Head: Atraumatic. Nose: No congestion/rhinnorhea. Mouth/Throat: Mucous membranes are moist.   Neck: No stridor. Painless ROM.  Cardiovascular: Normal rate, regular rhythm. Grossly normal heart sounds.  Good peripheral circulation. Respiratory: Normal respiratory effort.  No retractions. Lungs CTAB. Gastrointestinal: Soft and nontender. No distention. No abdominal bruits. No CVA tenderness. Genitourinary: No cervical motion tenderness no discharge no erythema no foreign body Musculoskeletal: No lower extremity tenderness nor edema.  No joint effusions. Neurologic:  Normal speech and language. No gross focal neurologic deficits are appreciated. No facial droop Skin:  Skin is warm, dry and intact. No rash noted. Psychiatric: Mood and affect are normal. Speech and behavior are normal.  ____________________________________________   LABS (all labs ordered are listed, but only abnormal results are displayed)  Results for orders placed or performed during the hospital encounter of 02/20/21 (from the past 24 hour(s))  Lipase,  blood     Status: None   Collection Time: 02/20/21  9:48 AM  Result Value Ref Range   Lipase 27 11 - 51 U/L  Comprehensive metabolic panel     Status: None   Collection Time: 02/20/21  9:48 AM  Result Value Ref Range   Sodium 136 135 - 145 mmol/L   Potassium  3.9 3.5 - 5.1 mmol/L   Chloride 104 98 - 111 mmol/L   CO2 26 22 - 32 mmol/L   Glucose, Bld 93 70 - 99 mg/dL   BUN 9 6 - 20 mg/dL   Creatinine, Ser 0.70 0.44 - 1.00 mg/dL   Calcium 9.1 8.9 - 10.3 mg/dL   Total Protein 8.1 6.5 - 8.1 g/dL   Albumin 3.5 3.5 - 5.0 g/dL   AST 19 15 - 41 U/L   ALT 13 0 - 44 U/L   Alkaline Phosphatase 45 38 - 126 U/L   Total Bilirubin 0.7 0.3 - 1.2 mg/dL   GFR, Estimated >60 >60 mL/min   Anion gap 6 5 - 15  CBC     Status: Abnormal   Collection Time: 02/20/21  9:48 AM  Result Value Ref Range   WBC 8.3 4.0 - 10.5 K/uL   RBC 4.86 3.87 - 5.11 MIL/uL   Hemoglobin 12.1 12.0 - 15.0 g/dL   HCT 38.6 36.0 - 46.0 %   MCV 79.4 (L) 80.0 - 100.0 fL   MCH 24.9 (L) 26.0 - 34.0 pg   MCHC 31.3 30.0 - 36.0 g/dL   RDW 14.6 11.5 - 15.5 %   Platelets 294 150 - 400 K/uL   nRBC 0.0 0.0 - 0.2 %  Urinalysis, Routine w reflex microscopic Urine, Clean Catch     Status: Abnormal   Collection Time: 02/20/21  9:50 AM  Result Value Ref Range   Color, Urine YELLOW (A) YELLOW   APPearance HAZY (A) CLEAR   Specific Gravity, Urine 1.019 1.005 - 1.030   pH 8.0 5.0 - 8.0   Glucose, UA NEGATIVE NEGATIVE mg/dL   Hgb urine dipstick NEGATIVE NEGATIVE   Bilirubin Urine NEGATIVE NEGATIVE   Ketones, ur NEGATIVE NEGATIVE mg/dL   Protein, ur NEGATIVE NEGATIVE mg/dL   Nitrite NEGATIVE NEGATIVE   Leukocytes,Ua NEGATIVE NEGATIVE   RBC / HPF 0-5 0 - 5 RBC/hpf   WBC, UA 0-5 0 - 5 WBC/hpf   Bacteria, UA NONE SEEN NONE SEEN   Squamous Epithelial / LPF 6-10 0 - 5   Mucus PRESENT   POC urine preg, ED (not at [facility])     Status: None   Collection Time: 02/20/21 10:18 AM  Result Value Ref Range   Preg Test, Ur NEGATIVE NEGATIVE  Wet prep, genital     Status: None   Collection Time: 02/20/21  2:19 PM   Specimen: Cervical/Vaginal swab  Result Value Ref Range   Yeast Wet Prep HPF POC NONE SEEN NONE SEEN   Trich, Wet Prep NONE SEEN NONE SEEN   Clue Cells Wet Prep HPF POC NONE SEEN NONE SEEN    WBC, Wet Prep HPF POC <10 <10   Sperm NONE SEEN    ____________________________________________  EKG____________________________________________  RADIOLOGY  I personally reviewed all radiographic images ordered to evaluate for the above acute complaints and reviewed radiology reports and findings.  These findings were personally discussed with the patient.  Please see medical record for radiology report.  ____________________________________________   PROCEDURES  Procedure(s) performed:  Procedures    Critical Care performed:  no ____________________________________________   INITIAL IMPRESSION / ASSESSMENT AND PLAN / ED COURSE  Pertinent labs & imaging results that were available during my care of the patient were reviewed by me and considered in my medical decision making (see chart for details).   DDX: appendicitis, uti, toa, pid, colitis, msk strain torsion  Teresa Cooper is a 30 y.o. who presents to the ED with right lower quadrant abdominal pain.  No leukocytosis but patient with low-grade temperature and she does have some rebound tenderness on exam though no guarding or peritonitis will order CT imaging.  CT imaging does not show any evidence of acute appendicitis.  Patient on further questioning states that she is worried that she may have a sexually transmitted infection would like to be tested.  I have a low suspicion of torsion there is no sign of ovarian cysts or adnexal abnormalities not consistent with TOA.  Clinical Course as of 02/20/21 1541  Sun Feb 20, 2021  1534 Patient without cervical motion tenderness no cervical discharge or erythema.  Have a lower suspicion for PID.  Given patient's reassuring work-up does appear stable appropriate for outpatient follow-up.  Patient agreeable to plan. [PR]    Clinical Course User Index [PR] Merlyn Lot, MD    The patient was evaluated in Emergency Department today for the symptoms described in the history of  present illness. He/she was evaluated in the context of the global COVID-19 pandemic, which necessitated consideration that the patient might be at risk for infection with the SARS-CoV-2 virus that causes COVID-19. Institutional protocols and algorithms that pertain to the evaluation of patients at risk for COVID-19 are in a state of rapid change based on information released by regulatory bodies including the CDC and federal and state organizations. These policies and algorithms were followed during the patient's care in the ED.  As part of my medical decision making, I reviewed the following data within the [place] notes reviewed and incorporated, Labs reviewed, notes from prior ED visits and [place] Controlled Substance Database   ____________________________________________   FINAL CLINICAL IMPRESSION(S) / ED DIAGNOSES  Final diagnoses:  Lower abdominal pain      NEW MEDICATIONS STARTED DURING THIS VISIT:  New Prescriptions   No medications on file     Note:  This document was prepared using Dragon voice recognition software and may include unintentional dictation errors.    Merlyn Lot, MD 02/20/21 [phone]

## 2021-05-14 ENCOUNTER — Other Ambulatory Visit: Payer: Self-pay

## 2021-05-14 ENCOUNTER — Encounter: Payer: Self-pay | Admitting: Emergency Medicine

## 2021-05-14 ENCOUNTER — Emergency Department
Admission: EM | Admit: 2021-05-14 | Discharge: 2021-05-14 | Disposition: A | Discharge status: Home / Self Care | Payer: Self-pay | Attending: Emergency Medicine | Admitting: Emergency Medicine

## 2021-05-14 DIAGNOSIS — B9689 Other specified bacterial agents as the cause of diseases classified elsewhere: Secondary | ICD-10-CM

## 2021-05-14 DIAGNOSIS — N760 Acute vaginitis: Secondary | ICD-10-CM | POA: Insufficient documentation

## 2021-05-14 LAB — WET PREP, GENITAL
Sperm: NONE SEEN
Trich, Wet Prep: NONE SEEN
WBC, Wet Prep HPF POC: <10 (ref <10)
Yeast Wet Prep HPF POC: NONE SEEN

## 2021-05-14 LAB — CBC
HCT: 35.9 % — ABNORMAL LOW (ref 36.0–46.0)
Hemoglobin: 11 g/dL — ABNORMAL LOW (ref 12.0–15.0)
MCH: 24.9 pg — ABNORMAL LOW (ref 26.0–34.0)
MCHC: 30.6 g/dL (ref 30.0–36.0)
MCV: 81.4 fL (ref 80.0–100.0)
Platelets: 256 10*3/uL (ref 150–400)
RBC: 4.41 MIL/uL (ref 3.87–5.11)
RDW: 15.1 % (ref 11.5–15.5)
WBC: 7.5 10*3/uL (ref 4.0–10.5)
nRBC: 0 % (ref 0.0–0.2)

## 2021-05-14 LAB — URINALYSIS, ROUTINE W REFLEX MICROSCOPIC
Bilirubin Urine: NEGATIVE
Glucose, UA: NEGATIVE mg/dL
Ketones, ur: NEGATIVE mg/dL
Nitrite: NEGATIVE
Protein, ur: 30 mg/dL — AB
RBC / HPF: >50 RBC/hpf — ABNORMAL HIGH (ref 0–5)
Specific Gravity, Urine: 1.024 (ref 1.005–1.030)
pH: 7 (ref 5.0–8.0)

## 2021-05-14 LAB — COMPREHENSIVE METABOLIC PANEL
ALT: 9 U/L (ref 0–44)
AST: 15 U/L (ref 15–41)
Albumin: 3.5 g/dL (ref 3.5–5.0)
Alkaline Phosphatase: 46 U/L (ref 38–126)
Anion gap: 8 (ref 5–15)
BUN: 14 mg/dL (ref 6–20)
CO2: 25 mmol/L (ref 22–32)
Calcium: 8.7 mg/dL — ABNORMAL LOW (ref 8.9–10.3)
Chloride: 106 mmol/L (ref 98–111)
Creatinine, Ser: 0.75 mg/dL (ref 0.44–1.00)
GFR, Estimated: >60 mL/min (ref >60)
Glucose, Bld: 97 mg/dL (ref 70–99)
Potassium: 3.5 mmol/L (ref 3.5–5.1)
Sodium: 139 mmol/L (ref 135–145)
Total Bilirubin: 0.2 mg/dL — ABNORMAL LOW (ref 0.3–1.2)
Total Protein: 6.9 g/dL (ref 6.5–8.1)

## 2021-05-14 LAB — CHLAMYDIA/NGC RT PCR (ARMC ONLY)
Chlamydia Tr: NOT DETECTED
N gonorrhoeae: NOT DETECTED

## 2021-05-14 LAB — POC URINE PREG, ED: Preg Test, Ur: NEGATIVE

## 2021-05-14 MED ORDER — METRONIDAZOLE 500 MG PO TABS: 500.0000 mg | ORAL_TABLET | Freq: Two times a day (BID) | ORAL | 0 refills | Status: DC

## 2021-05-14 NOTE — ED Triage Notes (Signed)
Pt states lower abd pain for several days. Pt denies dysuria, known hematuria, fever, vomiting, nausea, diarrhea vaginal discharge or pain in her back.

## 2021-05-14 NOTE — Discharge Instructions (Addendum)
Follow-up with your primary care provider or the [facility] if any continued problems.  Also a gonorrhea and Chlamydia test were sent to the lab and you will be able to see these results in [place].  If either 1 of these is positive call the health department who will treat you for free.  Take the metronidazole that was sent to the pharmacy until completely finished.  Do not mix this medication with alcohol as we have already discussed as it will cause severe vomiting.  You may take Tylenol or ibuprofen if needed for discomfort.

## 2021-05-14 NOTE — ED Provider Notes (Signed)
[facility] Provider Note    Event Date/Time   First MD Initiated Contact with Patient 05/14/21 [phone]     (approximate)   History   Abdominal Pain   HPI  Teresa Cooper is a 31 y.o. female   presents to the ED with complaint of low abdominal pain for approximately 3 to 4 days.  Patient denies any known vaginal charge or UTI symptoms.  She states that her menstrual period has started since being in the ED.  The cramping sensation that she is experiencing currently is not part of her regular menstrual period.  Patient also denies any hematuria, fever, nausea, vomiting, diarrhea or flank pain.  Patient has a history of pelvic adhesions and endometriosis.  She rates her pain as an 8 out of 10.      Physical Exam   Triage Vital Signs: ED Triage Vitals  Enc Vitals Group     BP 05/14/21 0454 128/84     Pulse Rate 05/14/21 0454 80     Resp 05/14/21 0454 18     Temp 05/14/21 0454 98.6 F (37 C)     Temp Source 05/14/21 0454 Oral     SpO2 05/14/21 0454 97 %     Weight 05/14/21 0454 168 lb (76.2 kg)     Height 05/14/21 0454 5\' 2"  (1.575 m)     Head Circumference --      Peak Flow --      Pain Score 05/14/21 0456 8     Pain Loc --      Pain Edu? --      Excl. in [place]? --     Most recent vital signs: Vitals:   05/14/21 0454 05/14/21 0616  BP: 128/84 119/88  Pulse: 80 84  Resp: 18 18  Temp: 98.6 F (37 C)   SpO2: 97% 99%     General: Awake, no distress.  Alert, ambulatory without any assistance. CV:  Good peripheral perfusion.  Heart regular rate and rhythm without murmur. Resp:  Normal effort.  Lungs are clear bilaterally. Abd:  No distention.  Other:  External genitalia is unremarkable.  There is some dark blood present along the vaginal walls.  No exudate and no active bleeding from the cervical os.  There is no adnexal masses or tenderness noted.  Minimal cervical motion tenderness and no chandelier sign.   ED Results / Procedures /  Treatments   Labs (all labs ordered are listed, but only abnormal results are displayed) Labs Reviewed  WET PREP, GENITAL - Abnormal; Notable for the following components:      Result Value   Clue Cells Wet Prep HPF POC PRESENT (*)    All other components within normal limits  COMPREHENSIVE METABOLIC PANEL - Abnormal; Notable for the following components:   Calcium 8.7 (*)    Total Bilirubin 0.2 (*)    All other components within normal limits  CBC - Abnormal; Notable for the following components:   Hemoglobin 11.0 (*)    HCT 35.9 (*)    MCH 24.9 (*)    All other components within normal limits  URINALYSIS, ROUTINE W REFLEX MICROSCOPIC - Abnormal; Notable for the following components:   Color, Urine YELLOW (*)    APPearance CLOUDY (*)    Hgb urine dipstick LARGE (*)    Protein, ur 30 (*)    Leukocytes,Ua TRACE (*)    RBC / HPF >50 (*)    Bacteria, UA RARE (*)  All other components within normal limits  CHLAMYDIA/NGC RT PCR ([facility] ONLY)            POC URINE PREG, ED       PROCEDURES:  Critical Care performed:   Procedures   MEDICATIONS ORDERED IN ED: Medications - No data to display   IMPRESSION / MDM / [place] / ED COURSE  I reviewed the triage vital signs and the nursing notes.   Differential diagnosis includes, but is not limited to, menstrual period cramps, UTI, vaginitis, STD.  31 year old female presents to the ED with complaint of pelvic discomfort along with starting her menses while in the ED.  She states the cramping that has occurred is different from her normal menstrual cramps.  Patient does have a history of pelvic adhesions and endometriosis.  Bimanual exam was unremarkable with the exception of some minimal cervical motion tenderness and minimal blood noted in the vaginal vault.  Wet prep did show clue cells.  Pregnancy test was negative.  Urinalysis showed trace leukocytes and greater than 50,000 RBCs due to menses.  There was rare  bacteria and mucus present.  Metabolic panel was unremarkable and reviewed.  Patient was made aware that she does have clue cells which is indicative of bacterial vaginosis.  Patient was given a prescription for Flagyl and made aware that this medication would not mix with alcohol and would cause severe nausea and vomiting.  She is aware that an STI panel was done and that she can see the results of this on MyChart.  She was referred to the [place] should either of these be positive for treatment.    FINAL CLINICAL IMPRESSION(S) / ED DIAGNOSES   Final diagnoses:  BV (bacterial vaginosis)     Rx / DC Orders   ED Discharge Orders          Ordered    metroNIDAZOLE (FLAGYL) 500 MG tablet  2 times daily        05/14/21 0820             Note:  This document was prepared using Dragon voice recognition software and may include unintentional dictation errors.   Johnn Hai, PA-C 05/14/21 0857    Rada Hay, MD 05/14/21 [phone]

## 2021-05-19 ENCOUNTER — Emergency Department: Payer: No Typology Code available for payment source

## 2021-05-19 ENCOUNTER — Emergency Department
Admission: EM | Admit: 2021-05-19 | Discharge: 2021-05-19 | Disposition: A | Discharge status: Home / Self Care | Payer: No Typology Code available for payment source | Attending: Student in an Organized Health Care Education/Training Program | Admitting: Student in an Organized Health Care Education/Training Program

## 2021-05-19 ENCOUNTER — Other Ambulatory Visit: Payer: Self-pay

## 2021-05-19 DIAGNOSIS — S8011XA Contusion of right lower leg, initial encounter: Secondary | ICD-10-CM

## 2021-05-19 DIAGNOSIS — S7011XA Contusion of right thigh, initial encounter: Secondary | ICD-10-CM

## 2021-05-19 DIAGNOSIS — Y92410 Unspecified street and highway as the place of occurrence of the external cause: Secondary | ICD-10-CM | POA: Insufficient documentation

## 2021-05-19 DIAGNOSIS — S7001XA Contusion of right hip, initial encounter: Secondary | ICD-10-CM | POA: Diagnosis not present

## 2021-05-19 DIAGNOSIS — M25551 Pain in right hip: Secondary | ICD-10-CM | POA: Diagnosis present

## 2021-05-19 LAB — POC URINE PREG, ED: Preg Test, Ur: NEGATIVE

## 2021-05-19 MED ORDER — HYDROCODONE-ACETAMINOPHEN 5-325 MG PO TABS: 1.0000 | ORAL_TABLET | Freq: Four times a day (QID) | ORAL | 0 refills | Status: DC | PRN

## 2021-05-19 NOTE — Discharge Instructions (Signed)
Follow-up with your primary care provider if any continued problems or concerns or urgent care.  You will be sore tomorrow more than you are currently.  Use ice or heat to your muscles as needed for discomfort.  Begin taking Tylenol or ibuprofen as needed.  The ibuprofen will help with inflammation which will also help with pain.  A prescription for hydrocodone was sent to the pharmacy if anything is needed for more severe pain while trying to sleep tonight.  Try to move as often as possible to avoid stiffness.
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
## 2021-05-26 ENCOUNTER — Telehealth: Payer: Self-pay | Admitting: Emergency Medicine

## 2021-05-26 MED ORDER — HYDROCODONE-ACETAMINOPHEN 5-325 MG PO TABS: 1.0000 | ORAL_TABLET | Freq: Four times a day (QID) | ORAL | 0 refills | Status: DC | PRN

## 2021-05-26 NOTE — Telephone Encounter (Signed)
As patient's Norco prescription was unable to be filled at [facility], this prescription was discontinued and resent to the [facility] on [address].  Patient contacted and told that medication prescription has been changed.

## 2021-10-05 ENCOUNTER — Emergency Department
Admission: EM | Admit: 2021-10-05 | Discharge: 2021-10-05 | Disposition: A | Discharge status: Home / Self Care | Payer: Self-pay | Attending: Emergency Medicine | Admitting: Emergency Medicine

## 2021-10-05 ENCOUNTER — Emergency Department: Payer: Self-pay

## 2021-10-05 ENCOUNTER — Encounter: Payer: Self-pay | Admitting: Emergency Medicine

## 2021-10-05 ENCOUNTER — Other Ambulatory Visit: Payer: Self-pay

## 2021-10-05 DIAGNOSIS — N83201 Unspecified ovarian cyst, right side: Secondary | ICD-10-CM | POA: Insufficient documentation

## 2021-10-05 DIAGNOSIS — D259 Leiomyoma of uterus, unspecified: Secondary | ICD-10-CM | POA: Insufficient documentation

## 2021-10-05 DIAGNOSIS — N730 Acute parametritis and pelvic cellulitis: Secondary | ICD-10-CM

## 2021-10-05 DIAGNOSIS — N739 Female pelvic inflammatory disease, unspecified: Secondary | ICD-10-CM | POA: Insufficient documentation

## 2021-10-05 DIAGNOSIS — D219 Benign neoplasm of connective and other soft tissue, unspecified: Secondary | ICD-10-CM

## 2021-10-05 LAB — CHLAMYDIA/NGC RT PCR (ARMC ONLY)
Chlamydia Tr: NOT DETECTED
N gonorrhoeae: NOT DETECTED

## 2021-10-05 LAB — COMPREHENSIVE METABOLIC PANEL
ALT: 13 U/L (ref 0–44)
AST: 18 U/L (ref 15–41)
Albumin: 3.9 g/dL (ref 3.5–5.0)
Alkaline Phosphatase: 42 U/L (ref 38–126)
Anion gap: 7 (ref 5–15)
BUN: 10 mg/dL (ref 6–20)
CO2: 26 mmol/L (ref 22–32)
Calcium: 8.9 mg/dL (ref 8.9–10.3)
Chloride: 104 mmol/L (ref 98–111)
Creatinine, Ser: 0.79 mg/dL (ref 0.44–1.00)
GFR, Estimated: >60 mL/min (ref >60)
Glucose, Bld: 96 mg/dL (ref 70–99)
Potassium: 3.7 mmol/L (ref 3.5–5.1)
Sodium: 137 mmol/L (ref 135–145)
Total Bilirubin: 0.4 mg/dL (ref 0.3–1.2)
Total Protein: 7.6 g/dL (ref 6.5–8.1)

## 2021-10-05 LAB — CBC
HCT: 34.4 % — ABNORMAL LOW (ref 36.0–46.0)
Hemoglobin: 10.2 g/dL — ABNORMAL LOW (ref 12.0–15.0)
MCH: 21.9 pg — ABNORMAL LOW (ref 26.0–34.0)
MCHC: 29.7 g/dL — ABNORMAL LOW (ref 30.0–36.0)
MCV: 74 fL — ABNORMAL LOW (ref 80.0–100.0)
Platelets: 300 10*3/uL (ref 150–400)
RBC: 4.65 MIL/uL (ref 3.87–5.11)
RDW: 16.6 % — ABNORMAL HIGH (ref 11.5–15.5)
WBC: 6.1 10*3/uL (ref 4.0–10.5)
nRBC: 0 % (ref 0.0–0.2)

## 2021-10-05 LAB — URINALYSIS, ROUTINE W REFLEX MICROSCOPIC
Bilirubin Urine: NEGATIVE
Glucose, UA: NEGATIVE mg/dL
Hgb urine dipstick: NEGATIVE
Ketones, ur: NEGATIVE mg/dL
Nitrite: NEGATIVE
Protein, ur: NEGATIVE mg/dL
Specific Gravity, Urine: 1.02 (ref 1.005–1.030)
pH: 7 (ref 5.0–8.0)

## 2021-10-05 LAB — WET PREP, GENITAL
Clue Cells Wet Prep HPF POC: NONE SEEN
Sperm: NONE SEEN
Trich, Wet Prep: NONE SEEN
WBC, Wet Prep HPF POC: <10 (ref <10)
Yeast Wet Prep HPF POC: NONE SEEN

## 2021-10-05 LAB — LIPASE, BLOOD: Lipase: 35 U/L (ref 11–51)

## 2021-10-05 LAB — POC URINE PREG, ED: Preg Test, Ur: NEGATIVE

## 2021-10-05 LAB — HIV ANTIBODY (ROUTINE TESTING W REFLEX): HIV Screen 4th Generation wRfx: NONREACTIVE

## 2021-10-05 MED ORDER — ONDANSETRON 4 MG PO TBDP: 4.0000 mg | ORAL_TABLET | Freq: Three times a day (TID) | ORAL | 0 refills | Status: AC | PRN

## 2021-10-05 MED ORDER — CEFTRIAXONE SODIUM 1 G IJ SOLR
500.0000 mg | Freq: Once | INTRAMUSCULAR | Status: AC
Start: 2021-10-05 — End: 2021-10-05
  Administered 2021-10-05: 500 mg via INTRAMUSCULAR
  Filled 2021-10-05: qty 10

## 2021-10-05 MED ORDER — KETOROLAC TROMETHAMINE 30 MG/ML IJ SOLN
15.0000 mg | Freq: Once | INTRAMUSCULAR | Status: AC
  Administered 2021-10-05: 15 mg via INTRAVENOUS
  Filled 2021-10-05: qty 1

## 2021-10-05 MED ORDER — DOXYCYCLINE HYCLATE 100 MG PO CAPS: 100.0000 mg | ORAL_CAPSULE | Freq: Two times a day (BID) | ORAL | 0 refills | Status: AC

## 2021-10-05 MED ORDER — IOHEXOL 300 MG/ML  SOLN
100.0000 mL | Freq: Once | INTRAMUSCULAR | Status: AC | PRN
  Administered 2021-10-05: 100 mL via INTRAVENOUS

## 2021-10-05 NOTE — ED Provider Notes (Signed)
[facility] Provider Note    Event Date/Time   First MD Initiated Contact with Patient 10/05/21 1351     (approximate)   History   Abdominal Pain   HPI  Teresa Cooper is a 31 y.o. female without significant past medical history presents for evaluation of 2 weeks of intermittent lower abdominal pain that she describes as fairly bilateral.  She states is gotten slightly worse over the last 24 hours.  She is not sure if she has had any abnormal vaginal bleeding or discharge.  She denies any burning with urination, diarrhea, constipation, nausea.  She states sometimes her abdominal pain will radiate to both sides of her lower back.  She has no chest pain, cough, shortness of breath fevers.  She states she has had a little nausea on and off for the last couple of weeks.  She states her last menstrual period was around the 24th of last month and is usually regular and she is not currently expecting her menstrual period.  She is sexually active and is not using protection.    Past Medical History:  Diagnosis Date   Medical history non-contributory      Physical Exam  Triage Vital Signs: ED Triage Vitals  Enc Vitals Group     BP 10/05/21 0950 108/78     Pulse Rate 10/05/21 0950 83     Resp 10/05/21 0950 18     Temp 10/05/21 0950 98.2 F (36.8 C)     Temp Source 10/05/21 0950 Oral     SpO2 10/05/21 0950 97 %     Weight 10/05/21 0951 165 lb (74.8 kg)     Height 10/05/21 0951 '5\' 2"'$  (1.575 m)     Head Circumference --      Peak Flow --      Pain Score 10/05/21 0950 6     Pain Loc --      Pain Edu? --      Excl. in [place]? --     Most recent vital signs: Vitals:   10/05/21 0950 10/05/21 1415  BP: 108/78 115/75  Pulse: 83 74  Resp: 18 17  Temp: 98.2 F (36.8 C) 97.8 F (36.6 C)  SpO2: 97% 100%    General: Awake, no distress.  CV:  Good peripheral perfusion.  Resp:  Normal effort.  Abd:  No distention.  Mild tenderness in the bilateral lower  quadrants and suprapubic region.  No significant CVA tenderness. Other:  There is some friability erythema cervical motion tenderness and a pelvic exam.  There is no bleeding.  Scant white discharge.   ED Results / Procedures / Treatments  Labs (all labs ordered are listed, but only abnormal results are displayed) Labs Reviewed  CBC - Abnormal; Notable for the following components:      Result Value   Hemoglobin 10.2 (*)    HCT 34.4 (*)    MCV 74.0 (*)    MCH 21.9 (*)    MCHC 29.7 (*)    RDW 16.6 (*)    All other components within normal limits  URINALYSIS, ROUTINE W REFLEX MICROSCOPIC - Abnormal; Notable for the following components:   Color, Urine YELLOW (*)    APPearance HAZY (*)    Leukocytes,Ua TRACE (*)    Bacteria, UA FEW (*)    All other components within normal limits  WET PREP, GENITAL  CHLAMYDIA/NGC RT PCR ([facility] ONLY)            URINE  CULTURE  LIPASE, BLOOD  COMPREHENSIVE METABOLIC PANEL  HIV ANTIBODY (ROUTINE TESTING W REFLEX)  RPR  POC URINE PREG, ED     EKG   RADIOLOGY  CT abdomen pelvis on my interpretation without evidence of diverticulitis, appendicitis, kidney stone, perinephric stranding or significant enlarged adnexa or abscess.  I reviewed radiology's interpretation and agree their findings of uterine fibroids and is 2.5 right follicular cyst without any other adnexal or acute abdominal or pelvic process.   PROCEDURES:  Critical Care performed: No  Procedures   MEDICATIONS ORDERED IN ED: Medications  ketorolac (TORADOL) 30 MG/ML injection 15 mg (15 mg Intravenous Given 10/05/21 1421)  cefTRIAXone (ROCEPHIN) injection 500 mg (500 mg Intramuscular Given 10/05/21 1509)  iohexol (OMNIPAQUE) 300 MG/ML solution 100 mL (100 mLs Intravenous Contrast Given 10/05/21 1459)     IMPRESSION / MDM / ASSESSMENT AND PLAN / ED COURSE  I reviewed the triage vital signs and the nursing notes. Patient's presentation is most consistent with acute presentation with  potential threat to life or bodily function.                               Differential diagnosis includes, but is not limited to PID, ovarian torsion, cyst, appendicitis, cystitis and diverticulitis.  CMP without any significant lecture light or metabolic derangements or evidence of hepatitis or acute cholestatic process.  Lipase is WNL and not suggestive of pancreatitis.  Pregnancy test is negative.  CBC without leukocytosis and stable anemia with hemoglobin of 10.2 compared to 11 4 months ago.  Normal platelets.  He is remarkable for trace leukocyte esterase and some bacteria but otherwise grossly unremarkable.  There is some squamous epithelial cells.  Wet prep is negative.  CT abdomen pelvis on my interpretation without evidence of diverticulitis, appendicitis, kidney stone, perinephric stranding or significant enlarged adnexa or abscess.  I reviewed radiology's interpretation and agree their findings of uterine fibroids and is 2.5 right follicular cyst without any other adnexal or acute abdominal or pelvic process.  Discussed with patient that I will treat her for PID but that she was also found to have a cyst and fibroid which may contribute to her symptoms.  Given there is no significant edema or enlargement of her adnexa otherwise I have a lower suspicion for torsion.  I will have her follow-up with gynecology.  Discussed importance of following up for HIV RPR gonorrhea chlamydia testing on MyChart and with gynecology and if there are any positives informing partners that they can get treated.  She voiced understanding agreement this plan.  Will write for Rx for doxycycline and Zofran.  Discussed using Tylenol ibuprofen for pain.  Discharged in stable condition.  Strict return cautions advised and discussed.   FINAL CLINICAL IMPRESSION(S) / ED DIAGNOSES   Final diagnoses:  PID (acute pelvic inflammatory disease)  Cyst of right ovary  Fibroid     Rx / DC Orders   ED Discharge Orders           Ordered    doxycycline (VIBRAMYCIN) 100 MG capsule  2 times daily        10/05/21 1551    ondansetron (ZOFRAN-ODT) 4 MG disintegrating tablet  Every 8 hours PRN        10/05/21 1558             Note:  This document was prepared using Dragon voice recognition software and may include unintentional dictation errors.  Lucrezia Starch, MD 10/05/21 [phone]

## 2021-10-05 NOTE — ED Triage Notes (Signed)
Patient to ED ambulatory from triage for lower abd pain intermittent x2 weeks. Patient states intermittent nausea but denies V/D. AOX4 in triage.

## 2021-10-05 NOTE — ED Provider Triage Note (Signed)
Emergency Medicine Provider Triage Evaluation Note  [employer] ALAIJAH GIBLER , a 31 y.o. female  was evaluated in triage.  Pt complains of abdominal pain for the past 2 weeks.  She is also had some intermittent nausea but without vomiting or diarrhea...  Physical Exam  BP 108/78   Pulse 83   Temp 98.2 F (36.8 C) (Oral)   Resp 18   Ht '5\' 2"'$  (1.575 m)   Wt 74.8 kg   SpO2 97%   BMI 30.18 kg/m  Gen:   Awake, no distress   Resp:  Normal effort  MSK:   Moves extremities without difficulty  Other:    Medical Decision Making  Medically screening exam initiated at 9:57 AM.  Appropriate orders placed.  Cassia SHEVONNE WOLF was informed that the remainder of the evaluation will be completed by another provider, this initial triage assessment does not replace that evaluation, and the importance of remaining in the ED until their evaluation is complete.    Victorino Dike, FNP 10/05/21 1206

## 2021-10-06 LAB — RPR: RPR Ser Ql: NONREACTIVE

## 2021-10-07 LAB — URINE CULTURE: Culture: NO GROWTH

## 2022-02-25 ENCOUNTER — Other Ambulatory Visit: Payer: Self-pay

## 2022-02-25 ENCOUNTER — Emergency Department
Admission: EM | Admit: 2022-02-25 | Discharge: 2022-02-25 | Disposition: A | Discharge status: Home / Self Care | Payer: Self-pay | Attending: Emergency Medicine | Admitting: Emergency Medicine

## 2022-02-25 ENCOUNTER — Emergency Department: Payer: Self-pay

## 2022-02-25 DIAGNOSIS — A749 Chlamydial infection, unspecified: Secondary | ICD-10-CM | POA: Insufficient documentation

## 2022-02-25 LAB — URINALYSIS, ROUTINE W REFLEX MICROSCOPIC
Bilirubin Urine: NEGATIVE
Glucose, UA: NEGATIVE mg/dL
Hgb urine dipstick: NEGATIVE
Ketones, ur: NEGATIVE mg/dL
Leukocytes,Ua: NEGATIVE
Nitrite: NEGATIVE
Protein, ur: NEGATIVE mg/dL
Specific Gravity, Urine: 1.004 — ABNORMAL LOW (ref 1.005–1.030)
pH: 6 (ref 5.0–8.0)

## 2022-02-25 LAB — CHLAMYDIA/NGC RT PCR (ARMC ONLY)
Chlamydia Tr: DETECTED — AB
N gonorrhoeae: NOT DETECTED

## 2022-02-25 LAB — COMPREHENSIVE METABOLIC PANEL
ALT: 10 U/L (ref 0–44)
AST: 17 U/L (ref 15–41)
Albumin: 3.8 g/dL (ref 3.5–5.0)
Alkaline Phosphatase: 39 U/L (ref 38–126)
Anion gap: 6 (ref 5–15)
BUN: 10 mg/dL (ref 6–20)
CO2: 23 mmol/L (ref 22–32)
Calcium: 8.8 mg/dL — ABNORMAL LOW (ref 8.9–10.3)
Chloride: 105 mmol/L (ref 98–111)
Creatinine, Ser: 0.8 mg/dL (ref 0.44–1.00)
GFR, Estimated: >60 mL/min (ref >60)
Glucose, Bld: 94 mg/dL (ref 70–99)
Potassium: 3.9 mmol/L (ref 3.5–5.1)
Sodium: 134 mmol/L — ABNORMAL LOW (ref 135–145)
Total Bilirubin: 0.5 mg/dL (ref 0.3–1.2)
Total Protein: 7.7 g/dL (ref 6.5–8.1)

## 2022-02-25 LAB — WET PREP, GENITAL
Clue Cells Wet Prep HPF POC: NONE SEEN
Sperm: NONE SEEN
Trich, Wet Prep: NONE SEEN
WBC, Wet Prep HPF POC: <10 (ref <10)
Yeast Wet Prep HPF POC: NONE SEEN

## 2022-02-25 LAB — LIPASE, BLOOD: Lipase: 33 U/L (ref 11–51)

## 2022-02-25 LAB — CBC
HCT: 33.9 % — ABNORMAL LOW (ref 36.0–46.0)
Hemoglobin: 10 g/dL — ABNORMAL LOW (ref 12.0–15.0)
MCH: 20.4 pg — ABNORMAL LOW (ref 26.0–34.0)
MCHC: 29.5 g/dL — ABNORMAL LOW (ref 30.0–36.0)
MCV: 69 fL — ABNORMAL LOW (ref 80.0–100.0)
Platelets: 268 10*3/uL (ref 150–400)
RBC: 4.91 MIL/uL (ref 3.87–5.11)
RDW: 18.5 % — ABNORMAL HIGH (ref 11.5–15.5)
WBC: 7.5 10*3/uL (ref 4.0–10.5)
nRBC: 0 % (ref 0.0–0.2)

## 2022-02-25 LAB — PREGNANCY, URINE: Preg Test, Ur: NEGATIVE

## 2022-02-25 LAB — HIV ANTIBODY (ROUTINE TESTING W REFLEX): HIV Screen 4th Generation wRfx: NONREACTIVE

## 2022-02-25 MED ORDER — CEFTRIAXONE SODIUM 1 G IJ SOLR
500.0000 mg | Freq: Once | INTRAMUSCULAR | Status: AC
  Administered 2022-02-25: 500 mg via INTRAMUSCULAR
  Filled 2022-02-25: qty 10

## 2022-02-25 MED ORDER — DOXYCYCLINE HYCLATE 100 MG PO TABS: 100.0000 mg | ORAL_TABLET | Freq: Two times a day (BID) | ORAL | 0 refills | Status: DC

## 2022-02-25 MED ORDER — DOXYCYCLINE HYCLATE 100 MG PO TABS
100.0000 mg | ORAL_TABLET | Freq: Once | ORAL | Status: AC
  Administered 2022-02-25: 100 mg via ORAL
  Filled 2022-02-25: qty 1

## 2022-02-25 MED ORDER — LIDOCAINE HCL (PF) 1 % IJ SOLN
1.0000 mL | Freq: Once | INTRAMUSCULAR | Status: AC
  Administered 2022-02-25: 2.1 mL
  Filled 2022-02-25: qty 5

## 2022-02-25 NOTE — ED Triage Notes (Signed)
Pt comes from home via POV c/o abd pain. Pt states lower abd pain started about 3 weeks accompanied with some nausea. Last BM was this morning. Denies diarrhea. Pt also states she wants to do an STD check. Pt in NAD, denies CP, SOB

## 2022-02-25 NOTE — ED Notes (Signed)
Called lab, they will run urine preg.

## 2022-02-25 NOTE — Discharge Instructions (Signed)
Take antibiotics.   Take acetaminophen 650 mg and ibuprofen 400 mg every 6 hours for pain.  Take with food.   Thank you for choosing [place] for your health care today!  Please see your primary doctor this week for a follow up appointment.   If you do not have a primary doctor call the following clinics to establish care:  If you have insurance:  [facility] [phone] [place] [address]   [facility]  [phone] [address] [place] [address]   If you do not have insurance:  [facility]  [phone] [address] [place] [address]  Sometimes, in the early stages of certain disease courses it is difficult to detect in the emergency department evaluation -- so, it is important that you continue to monitor your symptoms and call your doctor right away or return to the emergency department if you develop any new or worsening symptoms.  It was my pleasure to care for you today.   Hoover Brunette Jacelyn Grip, MD

## 2022-02-25 NOTE — ED Provider Notes (Signed)
[facility] Provider Note    Event Date/Time   First MD Initiated Contact with Patient 02/25/22 [phone]     (approximate)   History   Abdominal Pain   HPI  Teresa Cooper is a 31 y.o. female   Past medical history of history of endometriosis and prior pelvic adhesions status post lysis, prior STIs who presents to the emergency department with bilateral lower quadrant pain off and on intermittent sharp pains for the last 3 weeks.  Awoke with worsening pain today at side greater than left.  No nausea or vomiting.  No dysuria.  Some light white vaginal discharge.  Concern for STI given similar symptoms in the past.  Bowel movements have been normal. No  Fever or chills.  History was obtained via the patient. External medical notes reviewed including a surgery in 2014 for laparoscopic lysis of adhesions by gynecology       Physical Exam   Triage Vital Signs: ED Triage Vitals  Enc Vitals Group     BP 02/25/22 0600 120/60     Pulse Rate 02/25/22 0600 100     Resp 02/25/22 0600 16     Temp 02/25/22 0600 98.4 F (36.9 C)     Temp Source 02/25/22 0600 Oral     SpO2 02/25/22 0600 100 %     Weight 02/25/22 0601 168 lb (76.2 kg)     Height 02/25/22 0601 '5\' 2"'$  (1.575 m)     Head Circumference --      Peak Flow --      Pain Score 02/25/22 0612 6     Pain Loc --      Pain Edu? --      Excl. in [place]? --     Most recent vital signs: Vitals:   02/25/22 0600  BP: 120/60  Pulse: 100  Resp: 16  Temp: 98.4 F (36.9 C)  SpO2: 100%    General: Awake, no distress.  CV:  Good peripheral perfusion.  Resp:  Normal effort.  Abd:  No distention.  Soft and nontender deep palpation in all quadrants.  No rigidity or guarding.    ED Results / Procedures / Treatments   Labs (all labs ordered are listed, but only abnormal results are displayed) Labs Reviewed  CHLAMYDIA/NGC RT PCR ([facility] ONLY)           - Abnormal; Notable for the following components:       Result Value   Chlamydia Tr DETECTED (*)    All other components within normal limits  URINALYSIS, ROUTINE W REFLEX MICROSCOPIC - Abnormal; Notable for the following components:   Color, Urine STRAW (*)    APPearance CLEAR (*)    Specific Gravity, Urine 1.004 (*)    All other components within normal limits  COMPREHENSIVE METABOLIC PANEL - Abnormal; Notable for the following components:   Sodium 134 (*)    Calcium 8.8 (*)    All other components within normal limits  CBC - Abnormal; Notable for the following components:   Hemoglobin 10.0 (*)    HCT 33.9 (*)    MCV 69.0 (*)    MCH 20.4 (*)    MCHC 29.5 (*)    RDW 18.5 (*)    All other components within normal limits  WET PREP, GENITAL  LIPASE, BLOOD  PREGNANCY, URINE  RPR  HIV ANTIBODY (ROUTINE TESTING W REFLEX)     I reviewed labs and they are notable for anemia with a hemoglobin  of 10.0 which is baseline for this patient and no leukocytosis white blood cell count 7.5.    PROCEDURES:  Critical Care performed: No  Procedures   MEDICATIONS ORDERED IN ED: Medications  cefTRIAXone (ROCEPHIN) injection 500 mg (500 mg Intramuscular Given 02/25/22 0935)  lidocaine (PF) (XYLOCAINE) 1 % injection 1-2.1 mL (2.1 mLs Other Given 02/25/22 0934)  doxycycline (VIBRA-TABS) tablet 100 mg (100 mg Oral Given 02/25/22 0934)     IMPRESSION / MDM / ASSESSMENT AND PLAN / ED COURSE  I reviewed the triage vital signs and the nursing notes.                              Differential diagnosis includes, but is not limited to, STI, pelvic inflammatory disease, torsion, TOA, appendicitis, intra-abdominal infection, endometriosis, ovarian cyst, pregnancy related.   MDM: Pelvic pain in this patient 3 weeks intermittent will test for ovarian torsion and TOA, STIs with STI testing, transvaginal ultrasound, urinalysis.  Pregnancy test.  Given benign abdominal exam no leukocytosis and chronicity of pain and quality of pain bilateral location I  doubt appendicitis.  + Chlamydia, negative transvaginal ultrasound for torsion or TOA.  Patient stable and discharged with antibiotics.  Patient's presentation is most consistent with acute presentation with potential threat to life or bodily function.       FINAL CLINICAL IMPRESSION(S) / ED DIAGNOSES   Final diagnoses:  Chlamydia     Rx / DC Orders   ED Discharge Orders          Ordered    doxycycline (VIBRA-TABS) 100 MG tablet  2 times daily        02/25/22 0851             Note:  This document was prepared using Dragon voice recognition software and may include unintentional dictation errors.    Lucillie Garfinkel, MD 02/25/22 1021

## 2022-02-25 NOTE — ED Notes (Signed)
Pt to ED for intermittent RLQ abdominal pain since 3 weeks.

## 2022-02-26 LAB — RPR: RPR Ser Ql: NONREACTIVE

## 2022-04-13 ENCOUNTER — Other Ambulatory Visit: Payer: Self-pay

## 2022-04-13 ENCOUNTER — Encounter: Payer: Self-pay | Admitting: *Deleted

## 2022-04-13 ENCOUNTER — Emergency Department: Payer: Self-pay

## 2022-04-13 DIAGNOSIS — R0789 Other chest pain: Secondary | ICD-10-CM | POA: Insufficient documentation

## 2022-04-13 DIAGNOSIS — E876 Hypokalemia: Secondary | ICD-10-CM | POA: Insufficient documentation

## 2022-04-13 LAB — BASIC METABOLIC PANEL
Anion gap: 6 (ref 5–15)
BUN: 9 mg/dL (ref 6–20)
CO2: 28 mmol/L (ref 22–32)
Calcium: 8.7 mg/dL — ABNORMAL LOW (ref 8.9–10.3)
Chloride: 102 mmol/L (ref 98–111)
Creatinine, Ser: 0.71 mg/dL (ref 0.44–1.00)
GFR, Estimated: >60 mL/min (ref >60)
Glucose, Bld: 97 mg/dL (ref 70–99)
Potassium: 3.2 mmol/L — ABNORMAL LOW (ref 3.5–5.1)
Sodium: 136 mmol/L (ref 135–145)

## 2022-04-13 LAB — CBC
HCT: 31.5 % — ABNORMAL LOW (ref 36.0–46.0)
Hemoglobin: 9 g/dL — ABNORMAL LOW (ref 12.0–15.0)
MCH: 19.9 pg — ABNORMAL LOW (ref 26.0–34.0)
MCHC: 28.6 g/dL — ABNORMAL LOW (ref 30.0–36.0)
MCV: 69.5 fL — ABNORMAL LOW (ref 80.0–100.0)
Platelets: 294 10*3/uL (ref 150–400)
RBC: 4.53 MIL/uL (ref 3.87–5.11)
RDW: 17.9 % — ABNORMAL HIGH (ref 11.5–15.5)
WBC: 7.7 10*3/uL (ref 4.0–10.5)
nRBC: 0 % (ref 0.0–0.2)

## 2022-04-13 LAB — HEPATIC FUNCTION PANEL
ALT: 17 U/L (ref 0–44)
AST: 25 U/L (ref 15–41)
Albumin: 3.6 g/dL (ref 3.5–5.0)
Alkaline Phosphatase: 42 U/L (ref 38–126)
Bilirubin, Direct: <0.1 mg/dL (ref 0.0–0.2)
Total Bilirubin: 0.5 mg/dL (ref 0.3–1.2)
Total Protein: 7.4 g/dL (ref 6.5–8.1)

## 2022-04-13 LAB — TROPONIN I (HIGH SENSITIVITY)
Troponin I (High Sensitivity): 2 ng/L (ref <18)
Troponin I (High Sensitivity): <2 ng/L (ref <18)

## 2022-04-13 LAB — LIPASE, BLOOD: Lipase: 37 U/L (ref 11–51)

## 2022-04-13 LAB — POC URINE PREG, ED: Preg Test, Ur: NEGATIVE

## 2022-04-13 NOTE — ED Triage Notes (Signed)
Pt reports chest pain, dizziness.  No sob.  No v/d  pt also has nausea.   Sx for 1 week.  States worse today.  Pt alert speech clear.

## 2022-04-14 ENCOUNTER — Emergency Department
Admission: EM | Admit: 2022-04-14 | Discharge: 2022-04-14 | Disposition: A | Discharge status: Home / Self Care | Payer: Self-pay | Attending: Emergency Medicine | Admitting: Emergency Medicine

## 2022-04-14 DIAGNOSIS — E876 Hypokalemia: Secondary | ICD-10-CM

## 2022-04-14 DIAGNOSIS — R0789 Other chest pain: Secondary | ICD-10-CM

## 2022-04-14 MED ORDER — POTASSIUM CHLORIDE CRYS ER 20 MEQ PO TBCR
40.0000 meq | EXTENDED_RELEASE_TABLET | Freq: Once | ORAL | Status: AC
  Administered 2022-04-14: 40 meq via ORAL
  Filled 2022-04-14: qty 2

## 2022-04-14 NOTE — Discharge Instructions (Signed)
Please take Tylenol and ibuprofen/Advil for your pain.  It is safe to take them together, or to alternate them every few hours.  Take up to 1000mg of Tylenol at a time, up to 4 times per day.  Do not take more than 4000 mg of Tylenol in 24 hours.  For ibuprofen, take 400-600 mg, 3 - 4 times per day.  

## 2022-04-14 NOTE — ED Provider Notes (Signed)
[facility] Provider Note    Event Date/Time   First MD Initiated Contact with Patient 04/14/22 0105     (approximate)   History   Chest Pain   HPI  Teresa Cooper is a 32 y.o. female who presents to the ED for evaluation of Chest Pain   Patient denies any medical history beyond obesity.  No OCPs or exogenous estrogen.  Patient presents to the ED for evaluation of 1-2 days of intermittent right-sided sharp chest pain.  No clear precipitators, no exertional symptoms, no syncope, falls or injuries.  No dyspnea, cough or fever.  Reports some presyncopal dizziness with standing over the past week without any falls or syncope.   Physical Exam   Triage Vital Signs: ED Triage Vitals  Enc Vitals Group     BP 04/13/22 2024 125/85     Pulse Rate 04/13/22 2024 70     Resp 04/13/22 2024 18     Temp 04/13/22 2024 98.2 F (36.8 C)     Temp Source 04/13/22 2024 Oral     SpO2 04/13/22 2024 96 %     Weight 04/13/22 2021 170 lb (77.1 kg)     Height 04/13/22 2021 5' (1.524 m)     Head Circumference --      Peak Flow --      Pain Score 04/13/22 2021 5     Pain Loc --      Pain Edu? --      Excl. in [place]? --     Most recent vital signs: Vitals:   04/13/22 2024 04/13/22 2320  BP: 125/85 120/80  Pulse: 70 77  Resp: 18 18  Temp: 98.2 F (36.8 C) 98.4 F (36.9 C)  SpO2: 96% 99%    General: Awake, no distress.  CV:  Good peripheral perfusion.  RRR without appreciable murmur Resp:  Normal effort.  CTAB Abd:  No distention.  MSK:  No deformity noted.  Somewhat tender to palpation that reproduces her symptoms.  No overlying skin changes. Neuro:  No focal deficits appreciated. Other:     ED Results / Procedures / Treatments   Labs (all labs ordered are listed, but only abnormal results are displayed) Labs Reviewed  BASIC METABOLIC PANEL - Abnormal; Notable for the following components:      Result Value   Potassium 3.2 (*)    Calcium 8.7 (*)     All other components within normal limits  CBC - Abnormal; Notable for the following components:   Hemoglobin 9.0 (*)    HCT 31.5 (*)    MCV 69.5 (*)    MCH 19.9 (*)    MCHC 28.6 (*)    RDW 17.9 (*)    All other components within normal limits  HEPATIC FUNCTION PANEL  LIPASE, BLOOD  POC URINE PREG, ED  TROPONIN I (HIGH SENSITIVITY)  TROPONIN I (HIGH SENSITIVITY)    EKG Sinus rhythm with a rate of 82 bpm.  Normal axis and intervals.  Treatment is baseline.  No for signs of acute ischemia.  RADIOLOGY CXR interpreted by me without evidence of acute cardiopulmonary pathology.  Official radiology report(s): DG Chest 2 View  Result Date: 04/13/2022 CLINICAL DATA:  Chest pain EXAM: CHEST - 2 VIEW COMPARISON:  12/12/2017 FINDINGS: The heart size and mediastinal contours are within normal limits. Both lungs are clear. The visualized skeletal structures are unremarkable. IMPRESSION: No active cardiopulmonary disease. Electronically Signed   By: Madie Reno.D.  On: 04/13/2022 20:49    PROCEDURES and INTERVENTIONS:  .1-3 Lead EKG Interpretation  Performed by: Vladimir Crofts, MD Authorized by: Vladimir Crofts, MD     Interpretation: normal     ECG rate:  74   ECG rate assessment: normal     Rhythm: sinus rhythm     Ectopy: none     Conduction: normal     Medications  potassium chloride SA (KLOR-CON M) CR tablet 40 mEq (has no administration in time range)     IMPRESSION / MDM / ASSESSMENT AND PLAN / ED COURSE  I reviewed the triage vital signs and the nursing notes.  Differential diagnosis includes, but is not limited to, ACS, PTX, PNA, muscle strain/spasm, PE, dissection  {Patient presents with symptoms of an acute illness or injury that is potentially life-threatening.  Fairly healthy and low risk 32 year old presents with atypical chest discomfort, possibly related to hypokalemia but ultimately suitable for outpatient management.  Look systemically well.  No active chest  pain, but is somewhat tender to that area.  Workup is fairly benign.  Has a microcytic anemia that I suspect is chronic.  Hypokalemia that may be related to her symptoms.  Normal lipase and LFTs.  Negative troponins and clear CXR.  EKG is nonischemic.  She is PERC negative and I doubt PE.  We will replete her potassium and discharged with close return precautions.      FINAL CLINICAL IMPRESSION(S) / ED DIAGNOSES   Final diagnoses:  Other chest pain  Hypokalemia     Rx / DC Orders   ED Discharge Orders     None        Note:  This document was prepared using Dragon voice recognition software and may include unintentional dictation errors.   Vladimir Crofts, MD 04/14/22 [phone]

## 2022-10-12 ENCOUNTER — Emergency Department
Admission: EM | Admit: 2022-10-12 | Discharge: 2022-10-12 | Disposition: A | Discharge status: Home / Self Care | Payer: Self-pay | Attending: Student in an Organized Health Care Education/Training Program | Admitting: Student in an Organized Health Care Education/Training Program

## 2022-10-12 ENCOUNTER — Encounter: Payer: Self-pay | Admitting: Emergency Medicine

## 2022-10-12 ENCOUNTER — Emergency Department: Payer: Self-pay

## 2022-10-12 ENCOUNTER — Other Ambulatory Visit: Payer: Self-pay

## 2022-10-12 DIAGNOSIS — R1031 Right lower quadrant pain: Secondary | ICD-10-CM | POA: Insufficient documentation

## 2022-10-12 DIAGNOSIS — R300 Dysuria: Secondary | ICD-10-CM | POA: Insufficient documentation

## 2022-10-12 DIAGNOSIS — R102 Pelvic and perineal pain: Secondary | ICD-10-CM | POA: Insufficient documentation

## 2022-10-12 DIAGNOSIS — N898 Other specified noninflammatory disorders of vagina: Secondary | ICD-10-CM | POA: Insufficient documentation

## 2022-10-12 LAB — COMPREHENSIVE METABOLIC PANEL
ALT: 15 U/L (ref 0–44)
AST: 22 U/L (ref 15–41)
Albumin: 3.6 g/dL (ref 3.5–5.0)
Alkaline Phosphatase: 36 U/L — ABNORMAL LOW (ref 38–126)
Anion gap: 7 (ref 5–15)
BUN: 10 mg/dL (ref 6–20)
CO2: 23 mmol/L (ref 22–32)
Calcium: 8.8 mg/dL — ABNORMAL LOW (ref 8.9–10.3)
Chloride: 107 mmol/L (ref 98–111)
Creatinine, Ser: 0.77 mg/dL (ref 0.44–1.00)
GFR, Estimated: >60 mL/min (ref >60)
Glucose, Bld: 98 mg/dL (ref 70–99)
Potassium: 3.8 mmol/L (ref 3.5–5.1)
Sodium: 137 mmol/L (ref 135–145)
Total Bilirubin: 0.6 mg/dL (ref 0.3–1.2)
Total Protein: 7.2 g/dL (ref 6.5–8.1)

## 2022-10-12 LAB — URINALYSIS, ROUTINE W REFLEX MICROSCOPIC
Bilirubin Urine: NEGATIVE
Glucose, UA: NEGATIVE mg/dL
Hgb urine dipstick: NEGATIVE
Ketones, ur: NEGATIVE mg/dL
Leukocytes,Ua: NEGATIVE
Nitrite: NEGATIVE
Protein, ur: NEGATIVE mg/dL
Specific Gravity, Urine: 1.005 (ref 1.005–1.030)
pH: 6 (ref 5.0–8.0)

## 2022-10-12 LAB — CBC
HCT: 40.7 % (ref 36.0–46.0)
Hemoglobin: 12.5 g/dL (ref 12.0–15.0)
MCH: 24.6 pg — ABNORMAL LOW (ref 26.0–34.0)
MCHC: 30.7 g/dL (ref 30.0–36.0)
MCV: 80.1 fL (ref 80.0–100.0)
Platelets: 233 10*3/uL (ref 150–400)
RBC: 5.08 MIL/uL (ref 3.87–5.11)
RDW: 17.2 % — ABNORMAL HIGH (ref 11.5–15.5)
WBC: 6.1 10*3/uL (ref 4.0–10.5)
nRBC: 0 % (ref 0.0–0.2)

## 2022-10-12 LAB — WET PREP, GENITAL
Clue Cells Wet Prep HPF POC: NONE SEEN
Sperm: NONE SEEN
Trich, Wet Prep: NONE SEEN
WBC, Wet Prep HPF POC: <10 (ref <10)
Yeast Wet Prep HPF POC: NONE SEEN

## 2022-10-12 LAB — CHLAMYDIA/NGC RT PCR (ARMC ONLY)
Chlamydia Tr: NOT DETECTED
N gonorrhoeae: NOT DETECTED

## 2022-10-12 LAB — POC URINE PREG, ED: Preg Test, Ur: NEGATIVE

## 2022-10-12 LAB — LIPASE, BLOOD: Lipase: 41 U/L (ref 11–51)

## 2022-10-12 NOTE — Discharge Instructions (Addendum)
Your gonorrhea, chlamydia, wet prep swabs were negative.  Your ultrasound shows a small fibroid only though no ovarian abnormalities.  Your urinalysis does not show evidence of infection.  Your blood work is normal.  It is possible that your pain is due to your endometriosis given that it correlates with your cycles.  Please return for any new, worsening, or change in symptoms.  Please follow-up with OB/GYN to establish care.  It was a pleasure caring for you today.

## 2022-10-12 NOTE — ED Provider Notes (Signed)
[facility] Provider Note    Event Date/Time   First MD Initiated Contact with Patient 10/12/22 [phone]     (approximate)   History   Abdominal Pain   HPI  Teresa Cooper is a 32 y.o. female with a past medical history of endometriosis, pelvic adhesions media who presents today for evaluation of right lower quadrant pain.  Patient reports that she often gets right lower quadrant pain with her she also notes that she has had burning with urination for the past 2 days.  She also notes white discharge that she is unsure of is different than her baseline discharge.  She has not had any fevers or chills.  No nausea or vomiting.  She reports that she has not been sexually active in 2 months.  She reports that she has 1 female partner.  She reports that she gets pain in the same location on the right side every time she has her menstrual period.  Patient Active Problem List   Diagnosis Date Noted   Pelvic adhesions 10/17/2012    Class: Present on Admission   Endometriosis 10/17/2012    Class: Present on Admission          Physical Exam   Triage Vital Signs: ED Triage Vitals  Encounter Vitals Group     BP 10/12/22 0720 (!) 143/107     Systolic BP Percentile --      Diastolic BP Percentile --      Pulse Rate 10/12/22 0720 89     Resp 10/12/22 0720 18     Temp 10/12/22 0720 98.2 F (36.8 C)     Temp Source 10/12/22 0720 Oral     SpO2 10/12/22 0720 99 %     Weight 10/12/22 0721 175 lb (79.4 kg)     Height 10/12/22 0721 5\' 2"  (1.575 m)     Head Circumference --      Peak Flow --      Pain Score 10/12/22 0720 6     Pain Loc --      Pain Education --      Exclude from Growth Chart --     Most recent vital signs: Vitals:   10/12/22 0720  BP: (!) 143/107  Pulse: 89  Resp: 18  Temp: 98.2 F (36.8 C)  SpO2: 99%    Physical Exam Vitals and nursing note reviewed.  Constitutional:      General: Awake and alert. No acute distress.    Appearance:  Normal appearance. The patient is obese.  HENT:     Head: Normocephalic and atraumatic.     Mouth: Mucous membranes are moist.  Eyes:     General: PERRL. Normal EOMs        Right eye: No discharge.        Left eye: No discharge.     Conjunctiva/sclera: Conjunctivae normal.  Cardiovascular:     Rate and Rhythm: Normal rate and regular rhythm.     Pulses: Normal pulses.  Pulmonary:     Effort: Pulmonary effort is normal. No respiratory distress.     Breath sounds: Normal breath sounds.  Abdominal:     Abdomen is soft. There is very mild right lower quadrant abdominal tenderness. No rebound or guarding. No distention. Patient declined pelvic exam Musculoskeletal:        General: No swelling. Normal range of motion.     Cervical back: Normal range of motion and neck supple.  Skin:  General: Skin is warm and dry.     Capillary Refill: Capillary refill takes less than 2 seconds.     Findings: No rash.  Neurological:     Mental Status: The patient is awake and alert.      ED Results / Procedures / Treatments   Labs (all labs ordered are listed, but only abnormal results are displayed) Labs Reviewed  COMPREHENSIVE METABOLIC PANEL - Abnormal; Notable for the following components:      Result Value   Calcium 8.8 (*)    Alkaline Phosphatase 36 (*)    All other components within normal limits  CBC - Abnormal; Notable for the following components:   MCH 24.6 (*)    RDW 17.2 (*)    All other components within normal limits  URINALYSIS, ROUTINE W REFLEX MICROSCOPIC - Abnormal; Notable for the following components:   Color, Urine STRAW (*)    APPearance HAZY (*)    All other components within normal limits  WET PREP, GENITAL  CHLAMYDIA/NGC RT PCR ([facility] ONLY)            LIPASE, BLOOD  POC URINE PREG, ED     EKG     RADIOLOGY I independently reviewed and interpreted imaging and agree with radiologists findings.     PROCEDURES:  Critical Care performed:    Procedures   MEDICATIONS ORDERED IN ED: Medications - No data to display   IMPRESSION / MDM / ASSESSMENT AND PLAN / ED COURSE  I reviewed the triage vital signs and the nursing notes.   Differential diagnosis includes, but is not limited to, ovarian cyst, ovarian torsion, tubo-ovarian abscess, endometriosis, urinary tract infection, pyelonephritis, appendicitis, nephrolithiasis.  Patient is awake and alert, hemodynamically stable and afebrile.  She is nontoxic in appearance.  She points to the right lower quadrant as the area where she has mild discomfort, though she has no pain with palpation of this area.  She has no CVA tenderness.  Labs obtained in triage are overall reassuring.  Urinalysis does not reveal evidence of infection.  Ultrasound obtained reveals no ovarian concerns, cysts, or other abnormal findings with the exception of a transmural fibroid.  She declined pelvic exam, understands that I am unable to diagnose things like pelvic inflammatory disease or see any sort of other abnormalities down there without doing a pelvic exam, she understands and still would not like to have this pelvic exam done today.  She opted to self swab, which was negative for gonorrhea, chlamydia, and negative wet prep.  She declined HIV testing today.  Given that her pain correlates with her menstrual cycles, it is quite possible that she has pain related to her endometriosis.  We discussed return precautions and the importance of close outpatient follow-up.  Patient understands and agrees with plan.  She was discharged in stable condition.   Patient's presentation is most consistent with acute complicated illness / injury requiring diagnostic workup.   FINAL CLINICAL IMPRESSION(S) / ED DIAGNOSES   Final diagnoses:  Pelvic pain in female     Rx / DC Orders   ED Discharge Orders     None        Note:  This document was prepared using Dragon voice recognition software and may include  unintentional dictation errors.   Keturah Shavers 10/12/22 1420    Willy Eddy, MD 10/12/22 1434

## 2022-10-12 NOTE — ED Triage Notes (Signed)
Patient to ED via POV for RLQ abd pain x1 week. Patient states she also has burning with urination that started yesterday. Hx of ovarian cyst.

## 2024-04-24 ENCOUNTER — Other Ambulatory Visit: Payer: Self-pay

## 2024-04-24 ENCOUNTER — Emergency Department (HOSPITAL_COMMUNITY)
Admission: EM | Admit: 2024-04-24 | Discharge: 2024-04-24 | Disposition: A | Discharge status: Home / Self Care | Payer: Self-pay | Attending: Emergency Medicine | Admitting: Emergency Medicine

## 2024-04-24 ENCOUNTER — Encounter (HOSPITAL_COMMUNITY): Payer: Self-pay

## 2024-04-24 DIAGNOSIS — R1030 Lower abdominal pain, unspecified: Secondary | ICD-10-CM | POA: Insufficient documentation

## 2024-04-24 DIAGNOSIS — Z202 Contact with and (suspected) exposure to infections with a predominantly sexual mode of transmission: Secondary | ICD-10-CM | POA: Insufficient documentation

## 2024-04-24 DIAGNOSIS — R1024 Suprapubic pain: Secondary | ICD-10-CM

## 2024-04-24 LAB — COMPREHENSIVE METABOLIC PANEL WITH GFR
ALT: 14 U/L (ref 0–44)
AST: 25 U/L (ref 15–41)
Albumin: 4.2 g/dL (ref 3.5–5.0)
Alkaline Phosphatase: 60 U/L (ref 38–126)
Anion gap: 9 (ref 5–15)
BUN: 8 mg/dL (ref 6–20)
CO2: 24 mmol/L (ref 22–32)
Calcium: 9.4 mg/dL (ref 8.9–10.3)
Chloride: 103 mmol/L (ref 98–111)
Creatinine, Ser: 0.77 mg/dL (ref 0.44–1.00)
GFR, Estimated: >60 mL/min
Glucose, Bld: 106 mg/dL — ABNORMAL HIGH (ref 70–99)
Potassium: 4.5 mmol/L (ref 3.5–5.1)
Sodium: 135 mmol/L (ref 135–145)
Total Bilirubin: <0.2 mg/dL (ref 0.0–1.2)
Total Protein: 7.9 g/dL (ref 6.5–8.1)

## 2024-04-24 LAB — URINALYSIS, ROUTINE W REFLEX MICROSCOPIC
Bacteria, UA: NONE SEEN
Bilirubin Urine: NEGATIVE
Glucose, UA: NEGATIVE mg/dL
Hgb urine dipstick: NEGATIVE
Ketones, ur: NEGATIVE mg/dL
Nitrite: NEGATIVE
Protein, ur: NEGATIVE mg/dL
Specific Gravity, Urine: 1.016 (ref 1.005–1.030)
pH: 6 (ref 5.0–8.0)

## 2024-04-24 LAB — WET PREP, GENITAL
Clue Cells Wet Prep HPF POC: NONE SEEN
Sperm: NONE SEEN
Trich, Wet Prep: NONE SEEN
WBC, Wet Prep HPF POC: <10
Yeast Wet Prep HPF POC: NONE SEEN

## 2024-04-24 LAB — PREGNANCY, URINE: Preg Test, Ur: NEGATIVE

## 2024-04-24 LAB — CBC
HCT: 38.7 % (ref 36.0–46.0)
Hemoglobin: 12.1 g/dL (ref 12.0–15.0)
MCH: 25.5 pg — ABNORMAL LOW (ref 26.0–34.0)
MCHC: 31.3 g/dL (ref 30.0–36.0)
MCV: 81.5 fL (ref 80.0–100.0)
Platelets: 316 K/uL (ref 150–400)
RBC: 4.75 MIL/uL (ref 3.87–5.11)
RDW: 16 % — ABNORMAL HIGH (ref 11.5–15.5)
WBC: 9.2 K/uL (ref 4.0–10.5)
nRBC: 0 % (ref 0.0–0.2)

## 2024-04-24 LAB — LIPASE, BLOOD: Lipase: 51 U/L (ref 11–51)

## 2024-04-24 MED ORDER — METRONIDAZOLE 500 MG PO TABS: 500.0000 mg | ORAL_TABLET | Freq: Two times a day (BID) | ORAL | 0 refills | Status: DC

## 2024-04-24 MED ORDER — DOXYCYCLINE HYCLATE 100 MG PO TABS: 100.0000 mg | ORAL_TABLET | Freq: Two times a day (BID) | ORAL | 0 refills | Status: AC

## 2024-04-24 MED ORDER — CEFTRIAXONE SODIUM 1 G IJ SOLR
500.0000 mg | Freq: Once | INTRAMUSCULAR | Status: AC
  Administered 2024-04-24: 500 mg via INTRAMUSCULAR
  Filled 2024-04-24: qty 10

## 2024-04-24 NOTE — ED Triage Notes (Signed)
 Reports several days of suprapubic pains that radiate to right flank and vaginal itching.

## 2024-04-24 NOTE — ED Provider Notes (Signed)
 " [employer] EMERGENCY DEPARTMENT AT [facility] Provider Note   CSN: [id number] Arrival date & time: 04/24/24  1811     Patient presents with: Abdominal Pain   Mandolin ALESA ECHEVARRIA is a 34 y.o. female.   Irie JEYLIN WOODMANSEE is a 34 y.o. female presenting with white vaginal discharge and suprapubic pain.   She has had suprapubic pain radiating to the right flank for several days.  It is not associated with fever or chills, but there is occasional nausea. She has increased urinary frequency without dysuria or increased thirst.  She has vaginal itching with thick white discharge. She has had the same sexual partner for several years and denies new partners. She had an STI treated at age 52 without recurrence and is not using hormonal birth control.  She has pelvic adhesions and endometriosis, treated with laparoscopic surgery in 2014. She denies known ovarian cysts and reports fibroids.  The history is provided by the patient.  Abdominal Pain Associated symptoms: vaginal discharge   Associated symptoms: no constipation, no diarrhea, no dysuria, no nausea, no vaginal bleeding and no vomiting        Prior to Admission medications  Medication Sig Start Date End Date Taking? Authorizing Provider  doxycycline  (VIBRA -TABS) 100 MG tablet Take 1 tablet (100 mg total) by mouth 2 (two) times daily. 04/24/24   Cleotilde Perkins, DO  ferrous sulfate  325 (65 FE) MG tablet Take 1 tablet (325 mg total) by mouth daily. 12/12/17 12/12/18  Dorothyann Drivers, MD    Allergies: Patient has no known allergies.    Review of Systems  Gastrointestinal:  Positive for abdominal pain. Negative for constipation, diarrhea, nausea and vomiting.  Genitourinary:  Positive for vaginal discharge. Negative for dysuria and vaginal bleeding.    Updated Vital Signs BP 129/83   Pulse (!) 104   Temp 99.1 F (37.3 C)   Resp 20   LMP  (LMP Unknown)   SpO2 100%   Physical Exam Constitutional:      General: She is  not in acute distress.    Appearance: She is well-developed and normal weight.  Cardiovascular:     Rate and Rhythm: Normal rate and regular rhythm.     Heart sounds: Normal heart sounds. No murmur heard. Pulmonary:     Effort: Pulmonary effort is normal. No respiratory distress.     Breath sounds: Normal breath sounds.  Abdominal:     General: Abdomen is flat. Bowel sounds are normal. There is no distension.     Palpations: Abdomen is soft. There is no mass.     Tenderness: There is no abdominal tenderness. There is no guarding.  Genitourinary:    Vagina: Normal. No vaginal discharge.     Cervix: Normal. No discharge.     Uterus: Normal. Not enlarged.      Adnexa: Right adnexa normal and left adnexa normal.  Skin:    General: Skin is warm.     Capillary Refill: Capillary refill takes less than 2 seconds.  Neurological:     General: No focal deficit present.     Mental Status: She is alert and oriented to person, place, and time.     (all labs ordered are listed, but only abnormal results are displayed) Labs Reviewed  COMPREHENSIVE METABOLIC PANEL WITH GFR - Abnormal; Notable for the following components:      Result Value   Glucose, Bld 106 (*)    All other components within normal limits  CBC - Abnormal;  Notable for the following components:   MCH 25.5 (*)    RDW 16.0 (*)    All other components within normal limits  URINALYSIS, ROUTINE W REFLEX MICROSCOPIC - Abnormal; Notable for the following components:   APPearance HAZY (*)    Leukocytes,Ua TRACE (*)    All other components within normal limits  WET PREP, GENITAL  LIPASE, BLOOD  PREGNANCY, URINE  SYPHILIS: RPR W/REFLEX TO RPR TITER AND TREPONEMAL ANTIBODIES, TRADITIONAL SCREENING AND DIAGNOSIS ALGORITHM  MISC [facility] TEST (SEND OUT)  GC/CHLAMYDIA PROBE AMP ([facility]) NOT AT [facility]    EKG: None  Radiology: No results found.   Procedures   Medications Ordered in the ED  cefTRIAXone  (ROCEPHIN ) injection  500 mg (500 mg Intramuscular Given 04/24/24 2140)    Clinical Course as of 04/24/24 2301  Thu Apr 24, 2024  2235 Wet prep, genital [EM]    Clinical Course User Index [EM] Cleotilde Perkins, DO                                 Medical Decision Making Naliah ISYS TIETJE is 34 y.o. female presenting with suprapubic abdominal pain in the setting of vaginal discharge. She is well appearing without pain on exam. CBC and BMP WNL. Pregnancy test negative. On pelvic exam cervix is normal appearing without abnormal discharge. Differential includes PID, UTI, STI, menorrhagia. UA negative for acute infection.   Will empirically treat for GC. Patient received 500 mg Ceftriaxone  IM today and sent 100 mg doxycycline  BID for 7 days. Wet prep resulted negative for yeast and trichomonas. Patient to follow up outpatient as needed.   Amount and/or Complexity of Data Reviewed Labs: ordered. Decision-making details documented in ED Course.  Risk Prescription drug management.       Final diagnoses:  Suprapubic abdominal pain  Possible exposure to STI    ED Discharge Orders          Ordered    doxycycline  (VIBRA -TABS) 100 MG tablet  2 times daily        04/24/24 2158    metroNIDAZOLE  (FLAGYL ) 500 MG tablet  2 times daily,   Status:  Discontinued        04/24/24 2243               Cleotilde Perkins, DO 04/24/24 2302    Elnor Jayson LABOR, DO 04/26/24 1532  "

## 2024-04-24 NOTE — Discharge Instructions (Addendum)
 -It was a pleasure caring for you today in the emergency department.  -We have sent [facility] to the [facility] [address].  -Return for worsening pelvic pain or any other worsening or worrisome symptoms -Follow up at the health department for syphilis and HIV testing. Your lab work was not able to be completed at [facility]. -The results from your STI testing will be available on mychart in the next 2-3 days. Please refrain from sexual activity until you complete the antibiotics or you receive a negative STI result on mychart.   -Please return to the emergency department for any worsening or worrisome symptoms.      RESOURCE GUIDE  Chronic Pain Problems: Contact [facility]  [phone] Patients need to be referred by their primary care doctor.  Insufficient Money for Medicine: Contact [employer]:  call [phone]  No Primary Care Doctor: Call [facility] Connect  [phone] - can help you locate a primary care doctor that  accepts your insurance, provides certain services, etc. Physician Referral Service- [phone]  Agencies that provide inexpensive medical care: [facility]  [phone] [facility] Internal Medicine  [phone] [facility]  [phone] [facility]  [phone] [facility]  [phone] [facility]  [phone]  Medicaid-accepting [facility] Providers: [facility]- [address], [address]  [phone], Mon-Fri 9am-7pm, Sat 9am-1pm [facility]- [address] [place], [address] [place]  [phone] [facility]- [address] [place]  [phone] [facility]- [address]  [phone] [facility]- [address] [place], [address], [phone]  Only accepts [place] patients after they have their name  applied to their card  Self Pay (no insurance) in [facility]: Sickle Cell Patients - [facility] Internal  Medicine  [address] [place], [phone] [facility]- [address] [place]  [phone]       [place] [facility] [place]- [address]       -     [facility]- see information above (Speak to [employer] if you do not have insurance)       -  [facility]- [address],  [phone]       -  [facility]- [address] [phone]       -  [facility]-  [address], [place], [phone]       -  [facility] - [address], [phone]       -  [facility]- [address] [phone], also [address], [phone]       -     [facility]- [address] [place], [phone], 1st & 3rd Saturday         every month, 10am-1pm  -     [facility]   [address] [place], [place].   Phone:  [phone], Fax:  [phone]. Hours of Operation:  9 am - 6 pm, M-F.  -     [facility]   [address], [place]   Phone: [phone], Fax: [phone]. Hours of Operation:  8:30 am - 5:30 pm, M-F.    Dental Assistance If unable to pay or uninsured, contact:  [facility]. to become  qualified for the adult dental clinic.  Patients with Medicaid: [facility] [phone] [address], [phone] [address] [phone]  If unable to pay, or uninsured, contact [facility] [phone] in [place], [phone] in [facility]) to become qualified for the adult dental clinic  [facility] [address] [place], [place] [address] [phone] [email or web address]  Other Proofreader Services: [facility]- [address] [place], [place], [place], [address], [phone], Ext. [phone], 2nd and 4th Thursday of the month at 6:30am.  10 clients each day by appointment, can sometimes see walk-in patients if someone does not show for an appointment. [facility]- [address] [place], [place], [address], [phone] [facility] [address] [place], [place], [address], [phone] [facility]- [phone] [facility]- [phone] [facility] [phone]

## 2024-04-25 LAB — GC/CHLAMYDIA PROBE AMP (~~LOC~~) NOT AT ARMC
Chlamydia: NEGATIVE
Comment: NEGATIVE
Comment: NORMAL
Neisseria Gonorrhea: NEGATIVE

## 2024-04-26 LAB — MISC LABCORP TEST (SEND OUT): Labcorp test code: 83935
